# Patient Record
Sex: Female | Born: 1970 | Race: White | Hispanic: No | Marital: Married | Smoking: Never smoker
Health system: Southern US, Community
[De-identification: ages and names within clinical notes are randomized; demographics above are authoritative.]

## PROBLEM LIST (undated history)

## (undated) DIAGNOSIS — K219 Gastro-esophageal reflux disease without esophagitis: Secondary | ICD-10-CM

## (undated) DIAGNOSIS — IMO0001 Reserved for inherently not codable concepts without codable children: Secondary | ICD-10-CM

## (undated) DIAGNOSIS — I10 Essential (primary) hypertension: Secondary | ICD-10-CM

## (undated) DIAGNOSIS — L680 Hirsutism: Secondary | ICD-10-CM

## (undated) DIAGNOSIS — E119 Type 2 diabetes mellitus without complications: Secondary | ICD-10-CM

## (undated) DIAGNOSIS — E7800 Pure hypercholesterolemia, unspecified: Secondary | ICD-10-CM

## (undated) HISTORY — DX: Hirsutism: L68.0

## (undated) HISTORY — PX: HYSTEROSCOPY: SHX211

## (undated) HISTORY — DX: Pure hypercholesterolemia, unspecified: E78.00

## (undated) HISTORY — DX: Essential (primary) hypertension: I10

## (undated) HISTORY — DX: Gastro-esophageal reflux disease without esophagitis: K21.9

## (undated) HISTORY — DX: Type 2 diabetes mellitus without complications: E11.9

## (undated) HISTORY — DX: Reserved for inherently not codable concepts without codable children: IMO0001

---

## 2001-07-11 ENCOUNTER — Other Ambulatory Visit: Admission: RE | Admit: 2001-07-11 | Discharge: 2001-07-11 | Payer: Self-pay | Admitting: Unknown Physician Specialty

## 2008-05-11 ENCOUNTER — Ambulatory Visit: Payer: Self-pay | Admitting: Obstetrics and Gynecology

## 2008-05-11 ENCOUNTER — Encounter: Payer: Self-pay | Admitting: Obstetrics and Gynecology

## 2008-05-11 ENCOUNTER — Other Ambulatory Visit: Admission: RE | Admit: 2008-05-11 | Discharge: 2008-05-11 | Payer: Self-pay | Admitting: Obstetrics and Gynecology

## 2008-05-24 ENCOUNTER — Ambulatory Visit: Payer: Self-pay | Admitting: Obstetrics and Gynecology

## 2008-05-24 HISTORY — PX: ENDOMETRIAL ABLATION: SHX621

## 2008-06-13 ENCOUNTER — Ambulatory Visit: Payer: Self-pay | Admitting: Obstetrics and Gynecology

## 2008-06-15 ENCOUNTER — Ambulatory Visit: Payer: Self-pay | Admitting: Obstetrics and Gynecology

## 2008-07-02 ENCOUNTER — Ambulatory Visit: Payer: Self-pay | Admitting: Obstetrics and Gynecology

## 2010-04-11 ENCOUNTER — Encounter: Payer: Self-pay | Admitting: Obstetrics and Gynecology

## 2010-04-24 ENCOUNTER — Encounter (INDEPENDENT_AMBULATORY_CARE_PROVIDER_SITE_OTHER): Payer: 59 | Admitting: Obstetrics and Gynecology

## 2010-04-24 ENCOUNTER — Other Ambulatory Visit: Payer: Self-pay | Admitting: Obstetrics and Gynecology

## 2010-04-24 ENCOUNTER — Other Ambulatory Visit (HOSPITAL_COMMUNITY)
Admission: RE | Admit: 2010-04-24 | Discharge: 2010-04-24 | Disposition: A | Discharge status: Home / Self Care | Payer: 59 | Source: Ambulatory Visit | Attending: Obstetrics and Gynecology | Admitting: Obstetrics and Gynecology

## 2010-04-24 DIAGNOSIS — Z833 Family history of diabetes mellitus: Secondary | ICD-10-CM

## 2010-04-24 DIAGNOSIS — Z13220 Encounter for screening for lipoid disorders: Secondary | ICD-10-CM

## 2010-04-24 DIAGNOSIS — R82998 Other abnormal findings in urine: Secondary | ICD-10-CM

## 2010-04-24 DIAGNOSIS — Z124 Encounter for screening for malignant neoplasm of cervix: Secondary | ICD-10-CM | POA: Insufficient documentation

## 2010-04-24 DIAGNOSIS — Z01419 Encounter for gynecological examination (general) (routine) without abnormal findings: Secondary | ICD-10-CM

## 2010-04-28 ENCOUNTER — Other Ambulatory Visit: Payer: Self-pay | Admitting: Obstetrics and Gynecology

## 2010-04-28 DIAGNOSIS — Z1231 Encounter for screening mammogram for malignant neoplasm of breast: Secondary | ICD-10-CM

## 2010-05-13 ENCOUNTER — Ambulatory Visit
Admission: RE | Admit: 2010-05-13 | Discharge: 2010-05-13 | Disposition: A | Discharge status: Home / Self Care | Payer: 59 | Source: Ambulatory Visit | Attending: Obstetrics and Gynecology | Admitting: Obstetrics and Gynecology

## 2010-05-13 DIAGNOSIS — Z1231 Encounter for screening mammogram for malignant neoplasm of breast: Secondary | ICD-10-CM

## 2010-12-18 ENCOUNTER — Ambulatory Visit (INDEPENDENT_AMBULATORY_CARE_PROVIDER_SITE_OTHER): Payer: 59 | Admitting: Obstetrics and Gynecology

## 2010-12-18 DIAGNOSIS — E7800 Pure hypercholesterolemia, unspecified: Secondary | ICD-10-CM

## 2010-12-19 ENCOUNTER — Other Ambulatory Visit: Payer: Self-pay | Admitting: *Deleted

## 2010-12-19 DIAGNOSIS — E7800 Pure hypercholesterolemia, unspecified: Secondary | ICD-10-CM

## 2011-01-02 ENCOUNTER — Ambulatory Visit: Payer: 59 | Admitting: Cardiovascular Disease

## 2011-01-12 ENCOUNTER — Ambulatory Visit (INDEPENDENT_AMBULATORY_CARE_PROVIDER_SITE_OTHER): Payer: 59 | Admitting: Cardiovascular Disease

## 2011-01-12 ENCOUNTER — Encounter: Payer: Self-pay | Admitting: Cardiovascular Disease

## 2011-01-12 VITALS — BP 128/78 | HR 80 | Ht 69.0 in | Wt 205.0 lb

## 2011-01-12 DIAGNOSIS — E7800 Pure hypercholesterolemia, unspecified: Secondary | ICD-10-CM | POA: Insufficient documentation

## 2011-01-12 NOTE — Patient Instructions (Signed)
Your physician recommends that you schedule a follow-up appointment as needed.   Your physician recommends that you continue on your current medications as directed. Please refer to the Current Medication list given to you today.  

## 2011-01-12 NOTE — Assessment & Plan Note (Signed)
The patient has mixed dyslipidemia. Her cardiovascular risk profile is otherwise falls in a low risk category. She reports poor eating habits and we spent an extensive amount of time discussing a strategy for diet and exercise. The specific recommendations include cutting back dramatically on fast food, reducing starch, using frequent healthy snacks, and initiating a walking program for exercise. I recommended a 6 month trial of lifestyle modification and followup lipids at that time. She sees Dr. Selinda Flavin for primary care and I recommended that she follow up with him for a repeat lipid panel and further recommendations. I would be happy to see her back at any time if questions arise. I suspect she will not require pharmacotherapy for lipid-lowering.  She has no symptoms or physical findings of overt cardiovascular disease.

## 2011-01-12 NOTE — Progress Notes (Signed)
HPI:  This is a 40 year old woman presented for initial evaluation of hypercholesterolemia. She has no past history of hypertension, diabetes, coronary artery disease, or smoking. Recent lipids showed a cholesterol of 215, HDL 39, triglycerides 278, and LDL 121. She was referred for further evaluation and treatment. The patient has no clinical symptoms of cardiac disease. She specifically denies chest pain, dyspnea, palpitations, lightheadedness, syncope, edema, or claudication symptoms. She reports poor eating habits and frequently needs fast food and sweets. She has a stressful job. She is not engage in regular exercise.  Outpatient Encounter Prescriptions as of 01/12/2011  Medication Sig Dispense Refill  . Esomeprazole Magnesium (NEXIUM PO) Take 1 capsule by mouth daily.       . IBUPROFEN PO Take by mouth as needed.       Marland Kitchen spironolactone (ALDACTONE) 50 MG tablet Take 1 tablet by mouth Twice daily.        Review of patient's allergies indicates no known allergies.  Past Medical History  Diagnosis Date  . Hirsutism     Past Surgical History  Procedure Date  . Endometrial ablation 05/2008    History   Social History  . Marital Status: Married    Spouse Name: N/A    Number of Children: 2  . Years of Education: N/A   Occupational History  . Production designer, theatre/television/film at [employer]    Social History Main Topics  . Smoking status: Never Smoker   . Smokeless tobacco: Not on file  . Alcohol Use: Yes     1-2 yearly maybe  . Drug Use: No  . Sexually Active: Not on file   Other Topics Concern  . Not on file   Social History Narrative  . No narrative on file    Family History  Problem Relation Age of Onset  . Hypertension Mother   . Hypertension Father   . Hypertension Brother   . Heart disease Maternal Grandmother   . Breast cancer Maternal Grandmother     BREAST CANCER  . Cancer Maternal Grandfather     LUNG CA  . Cancer Paternal Grandmother     BRAIN CA    ROS: General: no  fevers/chills/night sweats Eyes: no blurry vision, diplopia, or amaurosis ENT: no sore throat or hearing loss Resp: no cough, wheezing, or hemoptysis CV: no edema or palpitations GI: no abdominal pain, nausea, vomiting, diarrhea, or constipation GU: no dysuria, frequency, or hematuria Skin: no rash Neuro: positive for headaches Musculoskeletal: no joint pain or swelling Heme: no bleeding, DVT, or easy bruising Endo: no polydipsia or polyuria  BP 128/78  Pulse 80  Ht 5\' 9"  (1.753 m)  Wt 92.987 kg (205 lb)  BMI 30.27 kg/m2  PHYSICAL EXAM: Pt is alert and oriented, WD, WN, very pleasant woman in no distress. HEENT: normal Neck: JVP normal. Carotid upstrokes normal without bruits. No thyromegaly. Lungs: equal expansion, clear bilaterally CV: Apex is discrete and nondisplaced, RRR without murmur or gallop Abd: soft, NT, +BS, no bruit, no hepatosplenomegaly Back: no CVA tenderness Ext: no C/C/E        Femoral pulses 2+= without bruits        DP/PT pulses intact and = Skin: warm and dry without rash Neuro: CNII-XII intact             Strength intact = bilaterally  EKG:  Normal sinus rhythm 80 beats per minute, within normal limits.  ASSESSMENT AND PLAN:

## 2011-05-26 ENCOUNTER — Other Ambulatory Visit: Payer: Self-pay | Admitting: Obstetrics and Gynecology

## 2011-05-26 DIAGNOSIS — Z1231 Encounter for screening mammogram for malignant neoplasm of breast: Secondary | ICD-10-CM

## 2011-06-03 ENCOUNTER — Ambulatory Visit
Admission: RE | Admit: 2011-06-03 | Discharge: 2011-06-03 | Disposition: A | Discharge status: Home / Self Care | Payer: 59 | Source: Ambulatory Visit | Attending: Obstetrics and Gynecology | Admitting: Obstetrics and Gynecology

## 2011-06-03 DIAGNOSIS — Z1231 Encounter for screening mammogram for malignant neoplasm of breast: Secondary | ICD-10-CM

## 2011-06-04 ENCOUNTER — Encounter: Payer: Self-pay | Admitting: Obstetrics and Gynecology

## 2011-06-04 ENCOUNTER — Ambulatory Visit (INDEPENDENT_AMBULATORY_CARE_PROVIDER_SITE_OTHER): Payer: 59 | Admitting: Obstetrics and Gynecology

## 2011-06-04 ENCOUNTER — Other Ambulatory Visit (HOSPITAL_COMMUNITY)
Admission: RE | Admit: 2011-06-04 | Discharge: 2011-06-04 | Disposition: A | Discharge status: Home / Self Care | Payer: 59 | Source: Ambulatory Visit | Attending: Obstetrics and Gynecology | Admitting: Obstetrics and Gynecology

## 2011-06-04 VITALS — BP 124/80 | Ht 69.0 in | Wt 206.0 lb

## 2011-06-04 DIAGNOSIS — Z01419 Encounter for gynecological examination (general) (routine) without abnormal findings: Secondary | ICD-10-CM

## 2011-06-04 DIAGNOSIS — E7800 Pure hypercholesterolemia, unspecified: Secondary | ICD-10-CM

## 2011-06-04 NOTE — Progress Notes (Signed)
The patient came to see me today for annual GYN exam. Her periods are very tolerable since we did her endometrial ablation. She continues to have PMDD. She previously tried Effexor and Cymbalta but both cause sexual side effects although they were effective for PMDD. She notices increased hair growth on her face. She had normal female hormone levels. She tried 100 mg of spironolactone daily without help. She would like to try something else. She had her mammogram yesterday. She had elevated lipids previously and saw  Dr. Daleen Squibb. He did not feel she needed medication.  Physical examination: HEENT within normal limits. Neck: Thyroid not large. No masses. Supraclavicular nodes: not enlarged. Breasts: Examined in both sitting and lying  position. No skin changes and no masses. Abdomen: Soft no guarding rebound or masses or hernia. Pelvic: External: Within normal limits. BUS: Within normal limits. Vaginal:within normal limits. Good estrogen effect. No evidence of cystocele rectocele or enterocele. Cervix: clean. Uterus: Normal size and shape. Adnexa: No masses. Rectovaginal exam: Confirmatory and negative. Extremities: Within normal limits.  Assessment: #1. PMDD #2. Increased hair growth #3. Grandmother with early onset of breast cancer. No other family history of breast or ovarian cancer.  Plan: We are going to try oral contraceptives for both PMDD and increased hair growth. She was given samples of low Loestrin. She will let me know how she is doing. We discussed that we could add back to Aldactone as well. We discussed BRCA1 and BRCA2 testing. She understands that her risk is low although not 0. She will inform if she is interested in counseling or  Testing. She will return for lipid profile fasted.

## 2011-06-05 LAB — URINALYSIS W MICROSCOPIC + REFLEX CULTURE
Hgb urine dipstick: NEGATIVE
Nitrite: POSITIVE — AB
Protein, ur: NEGATIVE mg/dL
pH: 5.5 (ref 5.0–8.0)

## 2011-06-08 ENCOUNTER — Other Ambulatory Visit: Payer: Self-pay | Admitting: *Deleted

## 2011-06-08 DIAGNOSIS — N390 Urinary tract infection, site not specified: Secondary | ICD-10-CM

## 2011-06-08 LAB — URINE CULTURE: Colony Count: >=100000

## 2011-06-08 MED ORDER — NITROFURANTOIN MONOHYD MACRO 100 MG PO CAPS: 100.0000 mg | ORAL_CAPSULE | Freq: Two times a day (BID) | ORAL | Status: AC

## 2011-07-01 ENCOUNTER — Other Ambulatory Visit: Payer: 59

## 2011-07-01 DIAGNOSIS — E7800 Pure hypercholesterolemia, unspecified: Secondary | ICD-10-CM

## 2011-07-01 DIAGNOSIS — Z01419 Encounter for gynecological examination (general) (routine) without abnormal findings: Secondary | ICD-10-CM

## 2011-07-01 DIAGNOSIS — N390 Urinary tract infection, site not specified: Secondary | ICD-10-CM

## 2011-07-01 LAB — CBC WITH DIFFERENTIAL/PLATELET
Basophils Absolute: 0 10*3/uL (ref 0.0–0.1)
Basophils Relative: 0 % (ref 0–1)
Eosinophils Absolute: 0.1 10*3/uL (ref 0.0–0.7)
Eosinophils Relative: 2 % (ref 0–5)
HCT: 38.1 % (ref 36.0–46.0)
Hemoglobin: 12.4 g/dL (ref 12.0–15.0)
Lymphocytes Relative: 39 % (ref 12–46)
Lymphs Abs: 2 10*3/uL (ref 0.7–4.0)
MCH: 28.8 pg (ref 26.0–34.0)
MCHC: 32.5 g/dL (ref 30.0–36.0)
MCV: 88.6 fL (ref 78.0–100.0)
Monocytes Absolute: 0.3 10*3/uL (ref 0.1–1.0)
Monocytes Relative: 6 % (ref 3–12)
Neutro Abs: 2.6 10*3/uL (ref 1.7–7.7)
Neutrophils Relative %: 52 % (ref 43–77)
Platelets: 207 10*3/uL (ref 150–400)
RBC: 4.3 MIL/uL (ref 3.87–5.11)
RDW: 13.1 % (ref 11.5–15.5)
WBC: 5 10*3/uL (ref 4.0–10.5)

## 2011-07-01 LAB — LIPID PANEL
HDL: 41 mg/dL (ref >39)
LDL Cholesterol: 101 mg/dL — ABNORMAL HIGH (ref 0–99)

## 2011-07-02 ENCOUNTER — Other Ambulatory Visit: Payer: Self-pay | Admitting: *Deleted

## 2011-07-02 DIAGNOSIS — R319 Hematuria, unspecified: Secondary | ICD-10-CM

## 2011-07-02 LAB — URINALYSIS W MICROSCOPIC + REFLEX CULTURE
Bacteria, UA: NONE SEEN
Crystals: NONE SEEN
Leukocytes, UA: NEGATIVE
Nitrite: NEGATIVE
Specific Gravity, Urine: 1.021 (ref 1.005–1.030)
Squamous Epithelial / LPF: NONE SEEN
Urobilinogen, UA: 0.2 mg/dL (ref 0.0–1.0)

## 2011-07-03 ENCOUNTER — Other Ambulatory Visit: Payer: Self-pay | Admitting: *Deleted

## 2011-07-03 MED ORDER — NITROFURANTOIN MONOHYD MACRO 100 MG PO CAPS: 100.0000 mg | ORAL_CAPSULE | Freq: Two times a day (BID) | ORAL | Status: AC

## 2011-07-04 LAB — URINE CULTURE: Colony Count: >=100000

## 2011-07-29 ENCOUNTER — Other Ambulatory Visit: Payer: 59

## 2011-07-29 DIAGNOSIS — R319 Hematuria, unspecified: Secondary | ICD-10-CM

## 2011-07-30 LAB — URINALYSIS W MICROSCOPIC + REFLEX CULTURE
Crystals: NONE SEEN
Nitrite: POSITIVE — AB
Protein, ur: NEGATIVE mg/dL
Specific Gravity, Urine: 1.009 (ref 1.005–1.030)
Squamous Epithelial / LPF: NONE SEEN
Urobilinogen, UA: 0.2 mg/dL (ref 0.0–1.0)

## 2011-08-01 LAB — URINE CULTURE

## 2011-08-04 ENCOUNTER — Other Ambulatory Visit: Payer: Self-pay | Admitting: *Deleted

## 2011-08-04 ENCOUNTER — Telehealth: Payer: Self-pay | Admitting: *Deleted

## 2011-08-04 DIAGNOSIS — N390 Urinary tract infection, site not specified: Secondary | ICD-10-CM

## 2011-08-04 MED ORDER — CIPROFLOXACIN HCL 500 MG PO TABS: 500.0000 mg | ORAL_TABLET | Freq: Two times a day (BID) | ORAL | Status: AC

## 2011-08-04 MED ORDER — NORETHIN-ETH ESTRAD-FE BIPHAS 1 MG-10 MCG / 10 MCG PO TABS: 1.0000 | ORAL_TABLET | Freq: Every day | ORAL | Status: DC

## 2011-08-04 NOTE — Telephone Encounter (Signed)
Continue birth control pills. You can give her one year of refills.

## 2011-08-04 NOTE — Telephone Encounter (Signed)
Patient called to say she had done well on oc's.  Wants to know if she should continue? If so will need one month worth at [facility] then 90 day through [facility].  Please advise.

## 2011-08-04 NOTE — Telephone Encounter (Signed)
Addended byValeda Malm L on: 08/04/2011 04:05 PM   Modules accepted: Orders

## 2013-09-05 ENCOUNTER — Encounter: Payer: Self-pay | Admitting: Women's Health

## 2013-09-28 ENCOUNTER — Other Ambulatory Visit (HOSPITAL_COMMUNITY)
Admission: RE | Admit: 2013-09-28 | Discharge: 2013-09-28 | Disposition: A | Discharge status: Home / Self Care | Payer: 59 | Source: Ambulatory Visit | Attending: Women's Health | Admitting: Women's Health

## 2013-09-28 ENCOUNTER — Ambulatory Visit (INDEPENDENT_AMBULATORY_CARE_PROVIDER_SITE_OTHER): Payer: 59 | Admitting: Women's Health

## 2013-09-28 ENCOUNTER — Encounter: Payer: Self-pay | Admitting: Women's Health

## 2013-09-28 VITALS — BP 124/78 | Ht 68.5 in | Wt 199.8 lb

## 2013-09-28 DIAGNOSIS — Z1151 Encounter for screening for human papillomavirus (HPV): Secondary | ICD-10-CM | POA: Insufficient documentation

## 2013-09-28 DIAGNOSIS — Z01419 Encounter for gynecological examination (general) (routine) without abnormal findings: Secondary | ICD-10-CM | POA: Insufficient documentation

## 2013-09-28 DIAGNOSIS — R1031 Right lower quadrant pain: Secondary | ICD-10-CM

## 2013-09-28 DIAGNOSIS — N943 Premenstrual tension syndrome: Secondary | ICD-10-CM

## 2013-09-28 DIAGNOSIS — G8929 Other chronic pain: Secondary | ICD-10-CM

## 2013-09-28 MED ORDER — FLUOXETINE HCL 10 MG PO TABS: 10.0000 mg | ORAL_TABLET | Freq: Every day | ORAL | Status: DC

## 2013-09-28 NOTE — Patient Instructions (Signed)

## 2013-09-28 NOTE — Progress Notes (Signed)
[place] 05-Apr-1970 [id number]    History:    Presents for annual exam.  Light monthly cycle/vasectomy/2010 ablation. Struggles with PMDD. Normal Pap and mammogram history. Last annual and Mammogram 2013. 2013 triglycerides 226. Hypertension primary care managing.  Past medical history, past surgical history, family history and social history were all reviewed and documented in the EPIC chart. 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 Parents hypertension.  ROS:  A  12 point ROS was performed and pertinent positives and negatives are included.  Exam:  Filed Vitals:   09/28/13 0836  BP: 124/78    General appearance:  Normal Thyroid:  Symmetrical, normal in size, without palpable masses or nodularity. Respiratory  Auscultation:  Clear without wheezing or rhonchi Cardiovascular  Auscultation:  Regular rate, without rubs, murmurs or gallops  Edema/varicosities:  Not grossly evident Abdominal  Soft,nontender, without masses, guarding or rebound.  Liver/spleen:  No organomegaly noted  Hernia:  None appreciated  Skin  Inspection:  Grossly normal   Breasts: Examined lying and sitting.     Right: Without masses, retractions, discharge or axillary adenopathy.     Left: Without masses, retractions, discharge or axillary adenopathy. Gentitourinary   Inguinal/mons:  Normal without inguinal adenopathy  External genitalia:  Normal  BUS/Urethra/Skene's glands:  Normal  Vagina:  Normal  Cervix:  Normal  Uterus:   normal in size, shape and contour.  Midline and mobile  Adnexa/parametria:     Rt: Without masses or tenderness.   Lt: Without masses or tenderness.  Anus and perineum: Normal  Digital rectal exam: Normal sphincter tone without palpated masses or tenderness  Assessment/Plan:  43 y.o. MWF G2P2 for annual exam with complaint of PMS mood symptoms and intermittent right lower abdomen stabbing type pain.    PMS Light monthly  cycles/ablation/vasectomy Hypertension-labs at primary care  Plan: Options reviewed, increase exercise, Prozac 10 mg, states would prefer no medications but needs help,  will try. Prozac 10 mg day 12 through 24, continue to work on increasing exercise, leisure activities. Instructed to call if no relief. SBE's, schedule annual screening mammogram, calcium rich diet, vitamin D 1000 daily encouraged. Labs at primary care with HR HPV typing new screening guidelines reviewed. Fish oil supplement twice daily. Schedule ultrasound to rule out  ovarian related problem.  Note: This dictation was prepared with Dragon/digital dictation.  Any transcriptional errors that result are unintentional. [place], 1:57 PM 09/28/2013

## 2013-09-29 LAB — URINALYSIS W MICROSCOPIC + REFLEX CULTURE
BILIRUBIN URINE: NEGATIVE
Casts: NONE SEEN
Crystals: NONE SEEN
Glucose, UA: NEGATIVE mg/dL
HGB URINE DIPSTICK: NEGATIVE
Ketones, ur: NEGATIVE mg/dL
Nitrite: NEGATIVE
PROTEIN: NEGATIVE mg/dL
Specific Gravity, Urine: 1.016 (ref 1.005–1.030)
Urobilinogen, UA: 0.2 mg/dL (ref 0.0–1.0)
pH: 6 (ref 5.0–8.0)

## 2013-10-01 LAB — URINE CULTURE

## 2013-10-02 ENCOUNTER — Other Ambulatory Visit: Payer: Self-pay | Admitting: Women's Health

## 2013-10-02 LAB — CYTOLOGY - PAP

## 2013-10-02 MED ORDER — SULFAMETHOXAZOLE-TMP DS 800-160 MG PO TABS: 1.0000 | ORAL_TABLET | Freq: Two times a day (BID) | ORAL | Status: DC

## 2013-10-03 ENCOUNTER — Encounter: Payer: Self-pay | Admitting: Obstetrics and Gynecology

## 2013-10-16 ENCOUNTER — Telehealth: Payer: Self-pay | Admitting: *Deleted

## 2013-10-16 MED ORDER — CIPROFLOXACIN HCL 250 MG PO TABS: 250.0000 mg | ORAL_TABLET | Freq: Two times a day (BID) | ORAL | Status: DC

## 2013-10-16 NOTE — Telephone Encounter (Signed)
(  you are back up md) Pt was treated for UTI on 10/02/13 with Septra DS #6 tab, pt is c/o urgency and frequency. Pt asked if refill could be given?

## 2013-10-16 NOTE — Telephone Encounter (Signed)
Ciprofloxacin 250 mg twice a day x5 days. I sent a prescription through

## 2013-10-16 NOTE — Telephone Encounter (Signed)
Pt informed with the below note. 

## 2013-10-18 ENCOUNTER — Other Ambulatory Visit: Payer: 59

## 2013-10-18 ENCOUNTER — Ambulatory Visit: Payer: 59 | Admitting: Women's Health

## 2013-10-25 ENCOUNTER — Ambulatory Visit (INDEPENDENT_AMBULATORY_CARE_PROVIDER_SITE_OTHER): Payer: 59

## 2013-10-25 ENCOUNTER — Encounter: Payer: Self-pay | Admitting: Women's Health

## 2013-10-25 ENCOUNTER — Other Ambulatory Visit: Payer: Self-pay | Admitting: Women's Health

## 2013-10-25 ENCOUNTER — Ambulatory Visit (INDEPENDENT_AMBULATORY_CARE_PROVIDER_SITE_OTHER): Payer: 59 | Admitting: Women's Health

## 2013-10-25 DIAGNOSIS — N852 Hypertrophy of uterus: Secondary | ICD-10-CM

## 2013-10-25 DIAGNOSIS — D259 Leiomyoma of uterus, unspecified: Secondary | ICD-10-CM

## 2013-10-25 DIAGNOSIS — G8929 Other chronic pain: Secondary | ICD-10-CM

## 2013-10-25 DIAGNOSIS — R1031 Right lower quadrant pain: Principal | ICD-10-CM

## 2013-10-25 DIAGNOSIS — D251 Intramural leiomyoma of uterus: Secondary | ICD-10-CM | POA: Insufficient documentation

## 2013-10-25 NOTE — Progress Notes (Signed)
Patient ID: Allison Patterson, female   DOB: Mar 05, 1970, 43 y.o.   MRN: [id number] Presents for ultrasound. At annual exam complained of intermittent right lower quadrant pain stabbing in nature for the past few months. Endometrial ablation 2010 monthly cycles, most months are light.  Ultrasound: Transvaginal and transabdominal anteverted enlarged uterus uterus intramural fibroid 9 x 10 mm, right and left ovary normal with arterial blood flow noted. No apparent mass seen in the right or left adnexal. No free fluid.  Small intramural uterine fibroid  Plan: Reviewed normality of exam/ultrasound. Will keep pain calendar to track if activity, food or ovulation connected.

## 2013-12-25 ENCOUNTER — Encounter: Payer: Self-pay | Admitting: Women's Health

## 2014-10-02 ENCOUNTER — Encounter: Payer: Self-pay | Admitting: Women's Health

## 2014-10-18 ENCOUNTER — Ambulatory Visit (INDEPENDENT_AMBULATORY_CARE_PROVIDER_SITE_OTHER): Payer: 59 | Admitting: Women's Health

## 2014-10-18 ENCOUNTER — Encounter: Payer: Self-pay | Admitting: Women's Health

## 2014-10-18 VITALS — BP 118/80 | Ht 68.0 in | Wt 211.0 lb

## 2014-10-18 DIAGNOSIS — Z01419 Encounter for gynecological examination (general) (routine) without abnormal findings: Secondary | ICD-10-CM

## 2014-10-18 NOTE — Progress Notes (Signed)
[place] 1970/12/27 [id number]    History:    Presents for annual exam.  Monthly cycle/vasectomy/2010 ablation. Normal Pap and mammogram history. History of a small fibroid. Hypertension and anxiety managed by primary care.  Past medical history, past surgical history, family history and social history were all reviewed and documented in the EPIC chart. Works in Engineer, mining. 2 sons grown both doing well. Husband police officer planning to retire next year. Parents hypertension.  ROS:  A ROS was performed and pertinent positives and negatives are included.  Exam:  Filed Vitals:   10/18/14 1611  BP: 118/80    General appearance:  Normal Thyroid:  Symmetrical, normal in size, without palpable masses or nodularity. Respiratory  Auscultation:  Clear without wheezing or rhonchi Cardiovascular  Auscultation:  Regular rate, without rubs, murmurs or gallops  Edema/varicosities:  Not grossly evident Abdominal  Soft,nontender, without masses, guarding or rebound.  Liver/spleen:  No organomegaly noted  Hernia:  None appreciated  Skin  Inspection:  Grossly normal   Breasts: Examined lying and sitting.     Right: Without masses, retractions, discharge or axillary adenopathy.     Left: Without masses, retractions, discharge or axillary adenopathy. Gentitourinary   Inguinal/mons:  Normal without inguinal adenopathy  External genitalia:  Normal  BUS/Urethra/Skene's glands:  Normal  Vagina:  Normal  Cervix:  Normal  Uterus:   normal in size, shape and contour.  Midline and mobile  Adnexa/parametria:     Rt: Without masses or tenderness.   Lt: Without masses or tenderness.  Anus and perineum: Normal  Digital rectal exam: Normal sphincter tone without palpated masses or tenderness  Assessment/Plan:  44 y.o. MWF G2 P2 for annual exam with no complaints.  Light monthly cycles/vasectomy/ablation Small fibroid-1 cm Hypertension-primary care manages labs and meds  Plan: SBE's,  annual screening mammogram, instructed to schedule overdue. Reviewed importance of annual screen. Exercise, calcium rich diet,  vitamin D 1000 daily encouraged. Reviewed importance of decreasing calories for weight loss. UA, Pap normal with negative HR HPV typing 2015, new screening guidelines reviewed. Encouraged increasing leisure activities, yoga to deal with situational stress at work. Taking a when necessary antianxiety med per primary care.  Huel Cote WHNP, 5:10 PM 10/18/2014

## 2014-10-18 NOTE — Patient Instructions (Signed)

## 2014-10-19 LAB — URINALYSIS W MICROSCOPIC + REFLEX CULTURE
Bilirubin Urine: NEGATIVE
CASTS: NONE SEEN [LPF]
Crystals: NONE SEEN [HPF]
Glucose, UA: NEGATIVE
Hgb urine dipstick: NEGATIVE
Ketones, ur: NEGATIVE
NITRITE: NEGATIVE
PH: 6 (ref 5.0–8.0)
PROTEIN: NEGATIVE
Specific Gravity, Urine: 1.01 (ref 1.001–1.035)
YEAST: NONE SEEN [HPF]

## 2014-10-21 LAB — URINE CULTURE: Colony Count: >=100000

## 2014-10-23 ENCOUNTER — Other Ambulatory Visit: Payer: Self-pay | Admitting: Women's Health

## 2014-10-23 MED ORDER — CIPROFLOXACIN HCL 250 MG PO TABS: 250.0000 mg | ORAL_TABLET | Freq: Two times a day (BID) | ORAL | Status: DC

## 2015-04-25 ENCOUNTER — Other Ambulatory Visit: Payer: Self-pay

## 2015-04-25 DIAGNOSIS — Z1231 Encounter for screening mammogram for malignant neoplasm of breast: Secondary | ICD-10-CM

## 2015-05-01 ENCOUNTER — Ambulatory Visit
Admission: RE | Admit: 2015-05-01 | Discharge: 2015-05-01 | Disposition: A | Discharge status: Home / Self Care | Payer: 59 | Source: Ambulatory Visit

## 2015-05-01 DIAGNOSIS — Z1231 Encounter for screening mammogram for malignant neoplasm of breast: Secondary | ICD-10-CM

## 2016-01-22 ENCOUNTER — Ambulatory Visit (INDEPENDENT_AMBULATORY_CARE_PROVIDER_SITE_OTHER): Payer: 59 | Admitting: Women's Health

## 2016-01-22 ENCOUNTER — Encounter: Payer: Self-pay | Admitting: Women's Health

## 2016-01-22 VITALS — BP 119/80 | Ht 68.0 in | Wt 214.4 lb

## 2016-01-22 DIAGNOSIS — E1169 Type 2 diabetes mellitus with other specified complication: Secondary | ICD-10-CM | POA: Insufficient documentation

## 2016-01-22 DIAGNOSIS — N943 Premenstrual tension syndrome: Secondary | ICD-10-CM | POA: Diagnosis not present

## 2016-01-22 DIAGNOSIS — Z01419 Encounter for gynecological examination (general) (routine) without abnormal findings: Secondary | ICD-10-CM

## 2016-01-22 DIAGNOSIS — E669 Obesity, unspecified: Secondary | ICD-10-CM

## 2016-01-22 MED ORDER — FLUOXETINE HCL 10 MG PO CAPS: 10.0000 mg | ORAL_CAPSULE | Freq: Every day | ORAL | 12 refills | Status: DC

## 2016-01-22 NOTE — Patient Instructions (Signed)
Carbohydrate Counting for Diabetes Mellitus, Adult Carbohydrate counting is a method for keeping track of how many carbohydrates you eat. Eating carbohydrates naturally increases the amount of sugar (glucose) in the blood. Counting how many carbohydrates you eat helps keep your blood glucose within normal limits, which helps you manage your diabetes (diabetes mellitus). It is important to know how many carbohydrates you can safely have in each meal. This is different for every person. A diet and nutrition specialist (registered dietitian) can help you make a meal plan and calculate how many carbohydrates you should have at each meal and snack. Carbohydrates are found in the following foods:  Grains, such as breads and cereals.  Dried beans and soy products.  Starchy vegetables, such as potatoes, peas, and corn.  Fruit and fruit juices.  Milk and yogurt.  Sweets and snack foods, such as cake, cookies, candy, chips, and soft drinks. How do I count carbohydrates? There are two ways to count carbohydrates in food. You can use either of the methods or a combination of both. Reading "Nutrition Facts" on packaged food  The "Nutrition Facts" list is included on the labels of almost all packaged foods and beverages in the [place] It includes:  The serving size.  Information about nutrients in each serving, including the grams (g) of carbohydrate per serving. To use the "Nutrition Facts":  Decide how many servings you will have.  Multiply the number of servings by the number of carbohydrates per serving.  The resulting number is the total amount of carbohydrates that you will be having. Learning standard serving sizes of other foods  When you eat foods containing carbohydrates that are not packaged or do not include "Nutrition Facts" on the label, you need to measure the servings in order to count the amount of carbohydrates:  Measure the foods that you will eat with a food scale or measuring  cup, if needed.  Decide how many standard-size servings you will eat.  Multiply the number of servings by 15. Most carbohydrate-rich foods have about 15 g of carbohydrates per serving.  For example, if you eat 8 oz (170 g) of strawberries, you will have eaten 2 servings and 30 g of carbohydrates (2 servings x 15 g = 30 g).  For foods that have more than one food mixed, such as soups and casseroles, you must count the carbohydrates in each food that is included. The following list contains standard serving sizes of common carbohydrate-rich foods. Each of these servings has about 15 g of carbohydrates:   hamburger bun or  English muffin.   oz (15 mL) syrup.   oz (14 g) jelly.  1 slice of bread.  1 six-inch tortilla.  3 oz (85 g) cooked rice or pasta.  4 oz (113 g) cooked dried beans.  4 oz (113 g) starchy vegetable, such as peas, corn, or potatoes.  4 oz (113 g) hot cereal.  4 oz (113 g) mashed potatoes or  of a large baked potato.  4 oz (113 g) canned or frozen fruit.  4 oz (120 mL) fruit juice.  4-6 crackers.  6 chicken nuggets.  6 oz (170 g) unsweetened dry cereal.  6 oz (170 g) plain fat-free yogurt or yogurt sweetened with artificial sweeteners.  8 oz (240 mL) milk.  8 oz (170 g) fresh fruit or one small piece of fruit.  24 oz (680 g) popped popcorn. Example of carbohydrate counting Sample meal  3 oz (85 g) chicken breast.  6 oz (  170 g) brown rice.  4 oz (113 g) corn.  8 oz (240 mL) milk.  8 oz (170 g) strawberries with sugar-free whipped topping. Carbohydrate calculation 1. Identify the foods that contain carbohydrates:  Rice.  Corn.  Milk.  Strawberries. 2. Calculate how many servings you have of each food:  2 servings rice.  1 serving corn.  1 serving milk.  1 serving strawberries. 3. Multiply each number of servings by 15 g:  2 servings rice x 15 g = 30 g.  1 serving corn x 15 g = 15 g.  1 serving milk x 15 g = 15  g.  1 serving strawberries x 15 g = 15 g. 4. Add together all of the amounts to find the total grams of carbohydrates eaten:  30 g + 15 g + 15 g + 15 g = 75 g of carbohydrates total. This information is not intended to replace advice given to you by your health care provider. Make sure you discuss any questions you have with your health care provider. Document Released: 02/09/2005 Document Revised: 08/30/2015 Document Reviewed: 07/24/2015 Elsevier Interactive Patient Education  2017 [place] Maintenance, Female Introduction Adopting a healthy lifestyle and getting preventive care can go a long way to promote health and wellness. Talk with your health care provider about what schedule of regular examinations is right for you. This is a good chance for you to check in with your provider about disease prevention and staying healthy. In between checkups, there are plenty of things you can do on your own. Experts have done a lot of research about which lifestyle changes and preventive measures are most likely to keep you healthy. Ask your health care provider for more information. Weight and diet Eat a healthy diet  Be sure to include plenty of vegetables, fruits, low-fat dairy products, and lean protein.  Do not eat a lot of foods high in solid fats, added sugars, or salt.  Get regular exercise. This is one of the most important things you can do for your health.  Most adults should exercise for at least 150 minutes each week. The exercise should increase your heart rate and make you sweat (moderate-intensity exercise).  Most adults should also do strengthening exercises at least twice a week. This is in addition to the moderate-intensity exercise. Maintain a healthy weight  Body mass index (BMI) is a measurement that can be used to identify possible weight problems. It estimates body fat based on height and weight. Your health care provider can help determine your BMI and help  you achieve or maintain a healthy weight.  For females 24 years of age and older:  A BMI below 18.5 is considered underweight.  A BMI of 18.5 to 24.9 is normal.  A BMI of 25 to 29.9 is considered overweight.  A BMI of 30 and above is considered obese. Watch levels of cholesterol and blood lipids  You should start having your blood tested for lipids and cholesterol at 46 years of age, then have this test every 5 years.  You may need to have your cholesterol levels checked more often if:  Your lipid or cholesterol levels are high.  You are older than 46 years of age.  You are at high risk for heart disease. Cancer screening Lung Cancer  Lung cancer screening is recommended for adults 53-95 years old who are at high risk for lung cancer because of a history of smoking.  A yearly low-dose CT scan  of the lungs is recommended for people who:  Currently smoke.  Have quit within the past 15 years.  Have at least a 30-pack-year history of smoking. A pack year is smoking an average of one pack of cigarettes a day for 1 year.  Yearly screening should continue until it has been 15 years since you quit.  Yearly screening should stop if you develop a health problem that would prevent you from having lung cancer treatment. Breast Cancer  Practice breast self-awareness. This means understanding how your breasts normally appear and feel.  It also means doing regular breast self-exams. Let your health care provider know about any changes, no matter how small.  If you are in your 20s or 30s, you should have a clinical breast exam (CBE) by a health care provider every 1-3 years as part of a regular health exam.  If you are 38 or older, have a CBE every year. Also consider having a breast X-ray (mammogram) every year.  If you have a family history of breast cancer, talk to your health care provider about genetic screening.  If you are at high risk for breast cancer, talk to your health  care provider about having an MRI and a mammogram every year.  Breast cancer gene (BRCA) assessment is recommended for women who have family members with BRCA-related cancers. BRCA-related cancers include:  Breast.  Ovarian.  Tubal.  Peritoneal cancers.  Results of the assessment will determine the need for genetic counseling and BRCA1 and BRCA2 testing. Cervical Cancer  Your health care provider may recommend that you be screened regularly for cancer of the pelvic organs (ovaries, uterus, and vagina). This screening involves a pelvic examination, including checking for microscopic changes to the surface of your cervix (Pap test). You may be encouraged to have this screening done every 3 years, beginning at age 83.  For women ages 29-65, health care providers may recommend pelvic exams and Pap testing every 3 years, or they may recommend the Pap and pelvic exam, combined with testing for human papilloma virus (HPV), every 5 years. Some types of HPV increase your risk of cervical cancer. Testing for HPV may also be done on women of any age with unclear Pap test results.  Other health care providers may not recommend any screening for nonpregnant women who are considered low risk for pelvic cancer and who do not have symptoms. Ask your health care provider if a screening pelvic exam is right for you.  If you have had past treatment for cervical cancer or a condition that could lead to cancer, you need Pap tests and screening for cancer for at least 20 years after your treatment. If Pap tests have been discontinued, your risk factors (such as having a new sexual partner) need to be reassessed to determine if screening should resume. Some women have medical problems that increase the chance of getting cervical cancer. In these cases, your health care provider may recommend more frequent screening and Pap tests. Colorectal Cancer  This type of cancer can be detected and often prevented.  Routine  colorectal cancer screening usually begins at 46 years of age and continues through 46 years of age.  Your health care provider may recommend screening at an earlier age if you have risk factors for colon cancer.  Your health care provider may also recommend using home test kits to check for hidden blood in the stool.  A small camera at the end of a tube can be used to  examine your colon directly (sigmoidoscopy or colonoscopy). This is done to check for the earliest forms of colorectal cancer.  Routine screening usually begins at age 14.  Direct examination of the colon should be repeated every 5-10 years through 46 years of age. However, you may need to be screened more often if early forms of precancerous polyps or small growths are found. Skin Cancer  Check your skin from head to toe regularly.  Tell your health care provider about any new moles or changes in moles, especially if there is a change in a mole's shape or color.  Also tell your health care provider if you have a mole that is larger than the size of a pencil eraser.  Always use sunscreen. Apply sunscreen liberally and repeatedly throughout the day.  Protect yourself by wearing long sleeves, pants, a wide-brimmed hat, and sunglasses whenever you are outside. Heart disease, diabetes, and high blood pressure  High blood pressure causes heart disease and increases the risk of stroke. High blood pressure is more likely to develop in:  People who have blood pressure in the high end of the normal range (130-139/85-89 mm Hg).  People who are overweight or obese.  People who are African American.  If you are 80-47 years of age, have your blood pressure checked every 3-5 years. If you are 87 years of age or older, have your blood pressure checked every year. You should have your blood pressure measured twice-once when you are at a hospital or clinic, and once when you are not at a hospital or clinic. Record the average of the two  measurements. To check your blood pressure when you are not at a hospital or clinic, you can use:  An automated blood pressure machine at a pharmacy.  A home blood pressure monitor.  If you are between 56 years and 16 years old, ask your health care provider if you should take aspirin to prevent strokes.  Have regular diabetes screenings. This involves taking a blood sample to check your fasting blood sugar level.  If you are at a normal weight and have a low risk for diabetes, have this test once every three years after 46 years of age.  If you are overweight and have a high risk for diabetes, consider being tested at a younger age or more often. Preventing infection Hepatitis B  If you have a higher risk for hepatitis B, you should be screened for this virus. You are considered at high risk for hepatitis B if:  You were born in a country where hepatitis B is common. Ask your health care provider which countries are considered high risk.  Your parents were born in a high-risk country, and you have not been immunized against hepatitis B (hepatitis B vaccine).  You have HIV or AIDS.  You use needles to inject street drugs.  You live with someone who has hepatitis B.  You have had sex with someone who has hepatitis B.  You get hemodialysis treatment.  You take certain medicines for conditions, including cancer, organ transplantation, and autoimmune conditions. Hepatitis C  Blood testing is recommended for:  Everyone born from [birth year] through 1966.  Anyone with known risk factors for hepatitis C. Sexually transmitted infections (STIs)  You should be screened for sexually transmitted infections (STIs) including gonorrhea and chlamydia if:  You are sexually active and are younger than 46 years of age.  You are older than 46 years of age and your health care provider tells  you that you are at risk for this type of infection.  Your sexual activity has changed since you were last  screened and you are at an increased risk for chlamydia or gonorrhea. Ask your health care provider if you are at risk.  If you do not have HIV, but are at risk, it may be recommended that you take a prescription medicine daily to prevent HIV infection. This is called pre-exposure prophylaxis (PrEP). You are considered at risk if:  You are sexually active and do not regularly use condoms or know the HIV status of your partner(s).  You take drugs by injection.  You are sexually active with a partner who has HIV. Talk with your health care provider about whether you are at high risk of being infected with HIV. If you choose to begin PrEP, you should first be tested for HIV. You should then be tested every 3 months for as long as you are taking PrEP. Pregnancy  If you are premenopausal and you may become pregnant, ask your health care provider about preconception counseling.  If you may become pregnant, take 400 to 800 micrograms (mcg) of folic acid every day.  If you want to prevent pregnancy, talk to your health care provider about birth control (contraception). Osteoporosis and menopause  Osteoporosis is a disease in which the bones lose minerals and strength with aging. This can result in serious bone fractures. Your risk for osteoporosis can be identified using a bone density scan.  If you are 65 years of age or older, or if you are at risk for osteoporosis and fractures, ask your health care provider if you should be screened.  Ask your health care provider whether you should take a calcium or vitamin D supplement to lower your risk for osteoporosis.  Menopause may have certain physical symptoms and risks.  Hormone replacement therapy may reduce some of these symptoms and risks. Talk to your health care provider about whether hormone replacement therapy is right for you. Follow these instructions at home:  Schedule regular health, dental, and eye exams.  Stay current with your  immunizations.  Do not use any tobacco products including cigarettes, chewing tobacco, or electronic cigarettes.  If you are pregnant, do not drink alcohol.  If you are breastfeeding, limit how much and how often you drink alcohol.  Limit alcohol intake to no more than 1 drink per day for nonpregnant women. One drink equals 12 ounces of beer, 5 ounces of wine, or 1 ounces of hard liquor.  Do not use street drugs.  Do not share needles.  Ask your health care provider for help if you need support or information about quitting drugs.  Tell your health care provider if you often feel depressed.  Tell your health care provider if you have ever been abused or do not feel safe at home. This information is not intended to replace advice given to you by your health care provider. Make sure you discuss any questions you have with your health care provider. Document Released: 08/25/2010 Document Revised: 07/18/2015 Document Reviewed: 11/13/2014  2017 Elsevier

## 2016-01-22 NOTE — Progress Notes (Signed)
[place] 07/31/1970 [id number]    History:    Presents for annual exam. Monthly cycle/vasectomy/2010 ablation. Normal Pap and Mammogram history. History of a small fibroid. Has noticed increased tearfulness and breast tenderness with the onset of her cycle. Diagnosed with DM this past year, managing with Metformin with mild GI side effects. Notes weight gain. Hypertension, hypercholesterolemia,  managed by primary care.  Past medical history, past surgical history, family history and social history were all reviewed and documented in the EPIC chart. 2 sons both grown and married. Recently changed jobs to a less stressful one, currently an Web designer. Husband police officer planning to retire in December. Parents with hypertension  ROS:  A ROS was performed and pertinent positives and negatives are included.  Exam:  Vitals:   01/22/16 1143  BP: 119/80  Weight: 214 lb 6.4 oz (97.3 kg)  Height: 5\' 8"  (1.727 m)   Body mass index is 32.6 kg/m.   General appearance:  Normal Thyroid:  Symmetrical, normal in size, without palpable masses or nodularity. Respiratory  Auscultation:  Clear without wheezing or rhonchi Cardiovascular  Auscultation:  Regular rate, without rubs, murmurs or gallops  Edema/varicosities:  Not grossly evident Abdominal  Soft,nontender, without masses, guarding or rebound.  Liver/spleen:  No organomegaly noted  Hernia:  None appreciated  Skin  Inspection:  Grossly normal   Breasts: Examined lying and sitting.     Right: Without masses, retractions, discharge or axillary adenopathy.     Left: Without masses, retractions, discharge or axillary adenopathy. Gentitourinary   Inguinal/mons:  Normal without inguinal adenopathy  External genitalia:  Normal  BUS/Urethra/Skene's glands:  Normal  Vagina:  Normal  Cervix:  Normal  Uterus: normal in size, shape and contour.  Midline and mobile  Adnexa/parametria:     Rt: Without masses or  tenderness.   Lt: Without masses or tenderness.  Anus and perineum: Normal  Digital rectal exam: Normal sphincter tone without palpated masses or tenderness  Assessment/Plan:  45 y.o.  MWF G2 P2 for annual exam with PMS symptoms of tearfulness.   Light monthly cycles/vasectomy/ablation Small fibroid 1 cm DM 2, hypertension, hypercholesterolemia- managed by primary care    labs and meds Obesity PMS  Plan: Prescription for Prozac10 mg day 14-28 of cycle to help with PMS symptoms. Encouraged increasing leisure activities, yoga to deal with stress. exercise, instructed to call if no relief of symptoms.  SBE's, annual screening mammogram, 3-D encouraged history of dense breasts, exercise, calcium rich diet, vitamin D 1000 daily encouraged. Encouraged to attend diabetes education classes, decrease carbs in diet. Reviewed importance of decreasing calories for weight loss. UA, Pap normal with negative HR HPV typing 2015.      [place], 11:49 AM 01/22/2016

## 2016-02-24 DIAGNOSIS — E119 Type 2 diabetes mellitus without complications: Secondary | ICD-10-CM

## 2016-02-24 HISTORY — DX: Type 2 diabetes mellitus without complications: E11.9

## 2016-06-12 ENCOUNTER — Other Ambulatory Visit: Payer: Self-pay | Admitting: Women's Health

## 2016-06-12 DIAGNOSIS — Z1231 Encounter for screening mammogram for malignant neoplasm of breast: Secondary | ICD-10-CM

## 2016-07-06 ENCOUNTER — Ambulatory Visit
Admission: RE | Admit: 2016-07-06 | Discharge: 2016-07-06 | Disposition: A | Discharge status: Home / Self Care | Payer: 59 | Source: Ambulatory Visit | Attending: Women's Health | Admitting: Women's Health

## 2016-07-06 DIAGNOSIS — Z1231 Encounter for screening mammogram for malignant neoplasm of breast: Secondary | ICD-10-CM

## 2016-07-07 ENCOUNTER — Other Ambulatory Visit: Payer: Self-pay | Admitting: Women's Health

## 2016-07-07 DIAGNOSIS — R928 Other abnormal and inconclusive findings on diagnostic imaging of breast: Secondary | ICD-10-CM

## 2016-07-09 ENCOUNTER — Encounter: Payer: Self-pay | Admitting: Women's Health

## 2016-07-09 ENCOUNTER — Ambulatory Visit
Admission: RE | Admit: 2016-07-09 | Discharge: 2016-07-09 | Disposition: A | Discharge status: Home / Self Care | Payer: 59 | Source: Ambulatory Visit | Attending: Women's Health | Admitting: Women's Health

## 2016-07-09 DIAGNOSIS — R928 Other abnormal and inconclusive findings on diagnostic imaging of breast: Secondary | ICD-10-CM

## 2016-07-16 ENCOUNTER — Encounter: Payer: Self-pay | Admitting: Internal Medicine

## 2016-08-25 ENCOUNTER — Ambulatory Visit: Payer: 59 | Admitting: Nurse Practitioner

## 2016-08-31 ENCOUNTER — Ambulatory Visit (INDEPENDENT_AMBULATORY_CARE_PROVIDER_SITE_OTHER): Payer: 59 | Admitting: Gastroenterology

## 2016-08-31 ENCOUNTER — Encounter: Payer: Self-pay | Admitting: Gastroenterology

## 2016-08-31 DIAGNOSIS — K219 Gastro-esophageal reflux disease without esophagitis: Secondary | ICD-10-CM | POA: Diagnosis not present

## 2016-08-31 MED ORDER — PANTOPRAZOLE SODIUM 40 MG PO TBEC: 40.0000 mg | DELAYED_RELEASE_TABLET | Freq: Two times a day (BID) | ORAL | 3 refills | Status: DC

## 2016-08-31 MED ORDER — PANTOPRAZOLE SODIUM 40 MG PO TBEC: 40.0000 mg | DELAYED_RELEASE_TABLET | Freq: Two times a day (BID) | ORAL | Status: DC

## 2016-08-31 NOTE — Progress Notes (Deleted)
Primary Care Physician:  Glenda Chroman, MD Primary Gastroenterologist:  Dr.   Laurel Dimmer Complaint  Patient presents with  . Gastroesophageal Reflux    HPI:   Allison Patterson is a 46 y.o. female presenting today at the request of   Past Medical History:  Diagnosis Date  . Diabetes ([place]) JAN 2018  . Hirsutism   . Hypertension   . Reflux     Past Surgical History:  Procedure Laterality Date  . ENDOMETRIAL ABLATION  05/2008  . HYSTEROSCOPY      Current Outpatient Prescriptions  Medication Sig Dispense Refill  . Atorvastatin Calcium (LIPITOR PO) Take 20 mg by mouth daily.     . hydrochlorothiazide (HYDRODIURIL) 25 MG tablet Take 25 mg by mouth daily.    Marland Kitchen METFORMIN HCL PO Take 500 mg by mouth 2 (two) times daily.     . metoprolol succinate (TOPROL-XL) 50 MG 24 hr tablet Take 50 mg by mouth daily. Take with or immediately following a meal.    . pantoprazole (PROTONIX) 40 MG tablet Take 40 mg by mouth daily.    . ciprofloxacin (CIPRO) 250 MG tablet Take 1 tablet (250 mg total) by mouth 2 (two) times daily. (Patient not taking: Reported on 01/22/2016) 6 tablet 0  . FLUoxetine (PROZAC) 10 MG capsule Take 1 capsule (10 mg total) by mouth daily. Take day 14-28 of each cycle (Patient not taking: Reported on 08/31/2016) 30 capsule 12   No current facility-administered medications for this visit.     Allergies as of 08/31/2016  . (No Known Allergies)    Family History  Problem Relation Age of Onset  . Hypertension Mother   . Hypertension Father   . Hypertension Brother   . Heart disease Maternal Grandmother   . Breast cancer Maternal Grandmother        BREAST CANCER-Age 22's  . Cancer Maternal Grandfather        LUNG CA  . Cancer Paternal Grandmother        BRAIN CA    Social History   Social History  . Marital status: Married    Spouse name: N/A  . Number of children: 2  . Years of education: N/A   Occupational History  . 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 Smoking status: Never Smoker  . Smokeless tobacco: Never Used  . Alcohol use No  . Drug use: No  . Sexual activity: Yes    Birth control/ protection: Other-see comments     Comment: Vasectomy, INTERCOURSE AGE 57, SEXUAL PARTNERS LESS THAN 5   Other Topics Concern  . Not on file   Social History Narrative  . No narrative on file    Review of Systems: Gen: Denies any fever, chills, fatigue, weight loss, lack of appetite.  CV: Denies chest pain, heart palpitations, peripheral edema, syncope.  Resp: Denies shortness of breath at rest or with exertion. Denies wheezing or cough.  GI: Denies dysphagia or odynophagia. Denies jaundice, hematemesis, fecal incontinence. GU : Denies urinary burning, urinary frequency, urinary hesitancy MS: Denies joint pain, muscle weakness, cramps, or limitation of movement.  Derm: Denies rash, itching, dry skin Psych: Denies depression, anxiety, memory loss, and confusion Heme: Denies bruising, bleeding, and enlarged lymph nodes.  Physical Exam: BP 127/85   Pulse (!) 115   Temp 98.3 F (36.8 C) (Oral)   Ht 5\' 9"  (1.753 m)   Wt 217 lb 3.2 oz (98.5 kg)  BMI 32.07 kg/m  General:   Alert and oriented. Pleasant and cooperative. Well-nourished and well-developed.  Head:  Normocephalic and atraumatic. Eyes:  Without icterus, sclera clear and conjunctiva pink.  Ears:  Normal auditory acuity. Nose:  No deformity, discharge,  or lesions. Mouth:  No deformity or lesions, oral mucosa pink.  Neck:  Supple, without mass or thyromegaly. Lungs:  Clear to auscultation bilaterally. No wheezes, rales, or rhonchi. No distress.  Heart:  S1, S2 present without murmurs appreciated.  Abdomen:  +BS, soft, non-tender and non-distended. No HSM noted. No guarding or rebound. No masses appreciated.  Rectal:  Deferred  Msk:  Symmetrical without gross deformities. Normal posture. Pulses:  Normal pulses noted. Extremities:  Without clubbing or  edema. Neurologic:  Alert and  oriented x4;  grossly normal neurologically. Skin:  Intact without significant lesions or rashes. Cervical Nodes:  No significant cervical adenopathy. Psych:  Alert and cooperative. Normal mood and affect.

## 2016-08-31 NOTE — Progress Notes (Signed)
Primary Care Physician:  Glenda Chroman, MD Primary Gastroenterologist:  Dr. Gala Romney   Chief Complaint  Patient presents with  . Gastroesophageal Reflux    HPI:   Allison Patterson is a 46 y.o. female presenting today at the request of her PCP and self-referral (her dad is also a patient here), with GERD.   Waking up with nocturnal reflux approximately once a week or so. On Protonix for many years. Has also been on Nexium. Has a cough that she can't get rid of for at least 5-6 months. Sometimes with pill dysphagia but no solid food dysphagia. States most of the time reflux is fine, but nighttime symptoms are worse. No abdominal pain. Has intermittent nausea but no vomiting. No unexplained weight loss or lack of appetite. Taking Protonix around midday, usually forgetful. Taking Zantac at night occasionally. Not eating late at night. Drinks a lot of [employer]. Eating the "wrong things" patient states. Feels diet is playing a large role.   No prior colonoscopy or EGD.   Past Medical History:  Diagnosis Date  . Diabetes ([place]) JAN 2018  . Hirsutism   . Hypertension   . Reflux     Past Surgical History:  Procedure Laterality Date  . ENDOMETRIAL ABLATION  05/2008  . HYSTEROSCOPY      Current Outpatient Prescriptions  Medication Sig Dispense Refill  . Atorvastatin Calcium (LIPITOR PO) Take 20 mg by mouth daily.     . hydrochlorothiazide (HYDRODIURIL) 25 MG tablet Take 25 mg by mouth daily.    Marland Kitchen METFORMIN HCL PO Take 500 mg by mouth 2 (two) times daily.     . metoprolol succinate (TOPROL-XL) 50 MG 24 hr tablet Take 50 mg by mouth daily. Take with or immediately following a meal.    . pantoprazole (PROTONIX) 40 MG tablet Take 1 tablet (40 mg total) by mouth 2 (two) times daily before a meal. 60 tablet 3  . pantoprazole (PROTONIX) 40 MG tablet Take 1 tablet (40 mg total) by mouth 2 (two) times daily before a meal. 60 tablet 3.   No current facility-administered medications for  this visit.     Allergies as of 08/31/2016  . (No Known Allergies)    Family History  Problem Relation Age of Onset  . Hypertension Mother   . Hypertension Father   . Hypertension Brother   . Heart disease Maternal Grandmother   . Breast cancer Maternal Grandmother        BREAST CANCER-Age 65's  . Cancer Maternal Grandfather        LUNG CA  . Cancer Paternal Grandmother        BRAIN CA  . Colon cancer Neg Hx   . Colon polyps Neg Hx     Social History   Social History  . Marital status: Married    Spouse name: N/A  . Number of children: 2  . Years of education: N/A   Occupational History  . 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 Smoking status: Never Smoker  . Smokeless tobacco: Never Used  . Alcohol use No  . Drug use: No  . Sexual activity: Yes    Birth control/ protection: Other-see comments     Comment: Vasectomy, INTERCOURSE AGE 71, SEXUAL PARTNERS LESS THAN 5   Other Topics Concern  . Not on file   Social History Narrative  . No narrative on file    Review of Systems: Gen: Denies any fever,  chills, fatigue, weight loss, lack of appetite.  CV: Denies chest pain, heart palpitations, peripheral edema, syncope.  Resp: Denies shortness of breath at rest or with exertion. Denies wheezing or cough.  GI: see HPI  GU : Denies urinary burning, urinary frequency, urinary hesitancy MS: Denies joint pain, muscle weakness, cramps, or limitation of movement.  Derm: Denies rash, itching, dry skin Psych: Denies depression, anxiety, memory loss, and confusion Heme: Denies bruising, bleeding, and enlarged lymph nodes.  Physical Exam: BP 127/85   Pulse (!) 115   Temp 98.3 F (36.8 C) (Oral)   Ht 5\' 9"  (1.753 m)   Wt 217 lb 3.2 oz (98.5 kg)   BMI 32.07 kg/m  General:   Alert and oriented. Pleasant and cooperative. Well-nourished and well-developed.  Head:  Normocephalic and atraumatic. Eyes:  Without icterus, sclera clear and conjunctiva pink.    Ears:  Normal auditory acuity. Nose:  No deformity, discharge,  or lesions. Mouth:  No deformity or lesions, oral mucosa pink. Lungs:  Clear to auscultation bilaterally. No wheezes, rales, or rhonchi. No distress.  Heart:  S1, S2 present without murmurs appreciated.  Abdomen:  +BS, soft, non-tender and non-distended. No HSM noted. No guarding or rebound. No masses appreciated.  Rectal:  Deferred  Msk:  Symmetrical without gross deformities. Normal posture. Extremities:  Without edema. Neurologic:  Alert and  oriented x4;  grossly normal neurologically. Psych:  Alert and cooperative. Normal mood and affect.

## 2016-08-31 NOTE — Patient Instructions (Addendum)
Let's increase Protonix to 30 minutes before breakfast and dinner for 1 month.   Take Zantac as needed at bedtime.  Follow the dietary recommendations on the handout provided.  We will see how you do with this!  I will see you in 4-6 weeks!   Food Choices for Gastroesophageal Reflux Disease, Adult When you have gastroesophageal reflux disease (GERD), the foods you eat and your eating habits are very important. Choosing the right foods can help ease the discomfort of GERD. Consider working with a diet and nutrition specialist (dietitian) to help you make healthy food choices. What general guidelines should I follow? Eating plan  Choose healthy foods low in fat, such as fruits, vegetables, whole grains, low-fat dairy products, and lean meat, fish, and poultry.  Eat frequent, small meals instead of three large meals each day. Eat your meals slowly, in a relaxed setting. Avoid bending over or lying down until 2-3 hours after eating.  Limit high-fat foods such as fatty meats or fried foods.  Limit your intake of oils, butter, and shortening to less than 8 teaspoons each day.  Avoid the following: ? Foods that cause symptoms. These may be different for different people. Keep a food diary to keep track of foods that cause symptoms. ? Alcohol. ? Drinking large amounts of liquid with meals. ? Eating meals during the 2-3 hours before bed.  Cook foods using methods other than frying. This may include baking, grilling, or broiling. Lifestyle   Maintain a healthy weight. Ask your health care provider what weight is healthy for you. If you need to lose weight, work with your health care provider to do so safely.  Exercise for at least 30 minutes on 5 or more days each week, or as told by your health care provider.  Avoid wearing clothes that fit tightly around your waist and chest.  Do not use any products that contain nicotine or tobacco, such as cigarettes and e-cigarettes. If you need  help quitting, ask your health care provider.  Sleep with the head of your bed raised. Use a wedge under the mattress or blocks under the bed frame to raise the head of the bed. What foods are not recommended? The items listed may not be a complete list. Talk with your dietitian about what dietary choices are best for you. Grains Pastries or quick breads with added fat. [place] toast. Vegetables Deep fried vegetables. [place] fries. Any vegetables prepared with added fat. Any vegetables that cause symptoms. For some people this may include tomatoes and tomato products, chili peppers, onions and garlic, and horseradish. Fruits Any fruits prepared with added fat. Any fruits that cause symptoms. For some people this may include citrus fruits, such as oranges, grapefruit, pineapple, and lemons. Meats and other protein foods High-fat meats, such as fatty beef or pork, hot dogs, ribs, ham, sausage, salami and bacon. Fried meat or protein, including fried fish and fried chicken. Nuts and nut butters. Dairy Whole milk and chocolate milk. Sour cream. Cream. Ice cream. Cream cheese. Milk shakes. Beverages Coffee and tea, with or without caffeine. Carbonated beverages. Sodas. Energy drinks. Fruit juice made with acidic fruits (such as orange or grapefruit). Tomato juice. Alcoholic drinks. Fats and oils Butter. Margarine. Shortening. Ghee. Sweets and desserts Chocolate and cocoa. Donuts. Seasoning and other foods Pepper. Peppermint and spearmint. Any condiments, herbs, or seasonings that cause symptoms. For some people, this may include curry, hot sauce, or vinegar-based salad dressings. Summary  When you have gastroesophageal reflux disease (  GERD), food and lifestyle choices are very important to help ease the discomfort of GERD.  Eat frequent, small meals instead of three large meals each day. Eat your meals slowly, in a relaxed setting. Avoid bending over or lying down until 2-3 hours after  eating.  Limit high-fat foods such as fatty meat or fried foods. This information is not intended to replace advice given to you by your health care provider. Make sure you discuss any questions you have with your health care provider. Document Released: 02/09/2005 Document Revised: 02/11/2016 Document Reviewed: 02/11/2016 Elsevier Interactive Patient Education  2017 [employer].

## 2016-09-01 NOTE — Assessment & Plan Note (Signed)
46 year old female with chronic GERD, intermittent nocturnal exacerbation, chronic cough. Likely related to dietary and behavior. Discussed increasing Protonix to BID just for one month, then returning to once daily. Take zantac only as needed at bedtime. Dietary and behavior modifications discussed in detail. No alarm features. 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 May need EGD if persistent issues despite aggressive changes. Return in 6 weeks to reassess.

## 2016-09-02 NOTE — Progress Notes (Signed)
CC'D TO PCP °

## 2016-09-24 ENCOUNTER — Encounter: Payer: Self-pay | Admitting: Gastroenterology

## 2016-10-23 ENCOUNTER — Ambulatory Visit: Payer: 59 | Admitting: Gastroenterology

## 2016-11-16 ENCOUNTER — Ambulatory Visit: Payer: 59 | Admitting: Gastroenterology

## 2017-01-25 ENCOUNTER — Encounter: Payer: Self-pay | Admitting: Women's Health

## 2017-01-25 ENCOUNTER — Ambulatory Visit (INDEPENDENT_AMBULATORY_CARE_PROVIDER_SITE_OTHER): Payer: 59 | Admitting: Women's Health

## 2017-01-25 VITALS — BP 122/78 | Ht 68.0 in | Wt 222.0 lb

## 2017-01-25 DIAGNOSIS — Z01419 Encounter for gynecological examination (general) (routine) without abnormal findings: Secondary | ICD-10-CM

## 2017-01-25 DIAGNOSIS — N952 Postmenopausal atrophic vaginitis: Secondary | ICD-10-CM | POA: Diagnosis not present

## 2017-01-25 MED ORDER — ESTRADIOL 10 MCG VA TABS: ORAL_TABLET | VAGINAL | 11 refills | Status: DC

## 2017-01-25 NOTE — Patient Instructions (Signed)

## 2017-01-25 NOTE — Progress Notes (Signed)
Allison Patterson Jul 29, 1970 [id number]    History:    Presents for annual exam. Cycles irregular, every 2-3 months. Cycles had been monthly, 2010 endometrial ablation, vasectomy. Normal Pap and mammogram history. Primary care manages diabetes, hypercholesterolemia. Recurrent UTIs in this past year has seen a urologist related to questionable perimenopausal status.  Past medical history, past surgical history, family history and social history were all reviewed and documented in the EPIC chart. 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 Parents hypertension.   ROS:  A ROS was performed and pertinent positives and negatives are included.  Exam:  Vitals:   01/25/17 0825  BP: 122/78  Weight: 222 lb (100.7 kg)  Height: 5\' 8"  (1.727 m)   Body mass index is 33.75 kg/m.   General appearance:  Normal Thyroid:  Symmetrical, normal in size, without palpable masses or nodularity. Respiratory  Auscultation:  Clear without wheezing or rhonchi Cardiovascular  Auscultation:  Regular rate, without rubs, murmurs or gallops  Edema/varicosities:  Not grossly evident Abdominal  Soft,nontender, without masses, guarding or rebound.  Liver/spleen:  No organomegaly noted  Hernia:  None appreciated  Skin  Inspection:  Grossly normal   Breasts: Examined lying and sitting.     Right: Without masses, retractions, discharge or axillary adenopathy.     Left: Without masses, retractions, discharge or axillary adenopathy. Gentitourinary   Inguinal/mons:  Normal without inguinal adenopathy  External genitalia:  Normal  BUS/Urethra/Skene's glands:  Normal  Vagina:  Normal  Cervix:  Normal  Uterus:   normal in size, shape and contour.  Midline and mobile  Adnexa/parametria:     Rt: Without masses or tenderness.   Lt: Without masses or tenderness.  Anus and perineum: Normal  Digital rectal exam: Normal sphincter tone without  palpated masses or tenderness  Assessment/Plan:  46 y.o. MWF G2 P2  for annual exam with history of recurrent UTIs .   Perimenopausal/irregular cycles/2010 endometrial ablation Husband vasectomy Obesity Diabetes/hypercholesterolemia-primary care manages labs and meds Recurrent UTIs in past year  Plan: SBE's, continue annual screening mammogram, calcium rich diet, vitamin D 1000 daily encouraged. Reviewed importance of increasing exercise and decreasing calories/carbs. Vagifem 1 applicator per vagina for 2 weeks and then twice weekly thereafter. UTI prevention discussed. Pap with HR HPV typing, new screening guidelines reviewed.  [place], 1:05 PM 01/25/2017

## 2017-01-27 LAB — PAP, TP IMAGING W/ HPV RNA, RFLX HPV TYPE 16,18/45: HPV DNA High Risk: NOT DETECTED

## 2017-08-20 ENCOUNTER — Other Ambulatory Visit: Payer: Self-pay | Admitting: Women's Health

## 2017-08-20 DIAGNOSIS — Z1231 Encounter for screening mammogram for malignant neoplasm of breast: Secondary | ICD-10-CM

## 2017-09-14 ENCOUNTER — Ambulatory Visit
Admission: RE | Admit: 2017-09-14 | Discharge: 2017-09-14 | Disposition: A | Discharge status: Home / Self Care | Payer: 59 | Source: Ambulatory Visit | Attending: Women's Health | Admitting: Women's Health

## 2017-09-14 DIAGNOSIS — Z1231 Encounter for screening mammogram for malignant neoplasm of breast: Secondary | ICD-10-CM

## 2017-09-15 ENCOUNTER — Encounter (INDEPENDENT_AMBULATORY_CARE_PROVIDER_SITE_OTHER): Payer: Self-pay

## 2017-11-16 ENCOUNTER — Encounter: Payer: Self-pay | Admitting: Internal Medicine

## 2017-11-24 ENCOUNTER — Telehealth: Payer: Self-pay

## 2017-11-24 ENCOUNTER — Encounter: Payer: Self-pay | Admitting: Nurse Practitioner

## 2017-11-24 ENCOUNTER — Other Ambulatory Visit: Payer: Self-pay

## 2017-11-24 ENCOUNTER — Ambulatory Visit (INDEPENDENT_AMBULATORY_CARE_PROVIDER_SITE_OTHER): Payer: 59 | Admitting: Nurse Practitioner

## 2017-11-24 VITALS — BP 118/82 | HR 80 | Temp 97.4°F | Ht 69.0 in | Wt 208.2 lb

## 2017-11-24 DIAGNOSIS — R05 Cough: Secondary | ICD-10-CM | POA: Diagnosis not present

## 2017-11-24 DIAGNOSIS — K219 Gastro-esophageal reflux disease without esophagitis: Secondary | ICD-10-CM

## 2017-11-24 DIAGNOSIS — R053 Chronic cough: Secondary | ICD-10-CM | POA: Insufficient documentation

## 2017-11-24 DIAGNOSIS — K5900 Constipation, unspecified: Secondary | ICD-10-CM

## 2017-11-24 MED ORDER — RANITIDINE HCL 150 MG PO TABS: 150.0000 mg | ORAL_TABLET | Freq: Every day | ORAL | 3 refills | Status: DC

## 2017-11-24 NOTE — Telephone Encounter (Signed)
PA for EGD submitted via [facility] website. Notification/prior authorization reference number is [id number].

## 2017-11-24 NOTE — Assessment & Plan Note (Signed)
Chronic history of GERD previously on Protonix once daily, Protonix twice daily, Protonix once daily with Zantac once daily in the evening.  She is currently back on Protonix once daily.  Multiple changes in her medications have not resulted in improvement in her cough.  Her other GERD symptoms are well controlled.  She will have a noticeable worsening of her typical GERD symptoms (esophageal burning, bitter sour taste, choking on bile) if she misses her PPI.  I feel her cough is likely not related to GERD.  However, given her persistent symptoms we will proceed with upper endoscopy to evaluate for any worsening symptoms on medications.  Follow-up in 3 months.  Proceed with EGD with Dr. Gala Romney in near future: the risks, benefits, and alternatives have been discussed with the patient in detail. The patient states understanding and desires to proceed.  The patient is not on any anticoagulants, anxiolytics, chronic pain medications, or antidepressants.  Conscious sedation should be adequate for her procedure.

## 2017-11-24 NOTE — Progress Notes (Signed)
Referring Provider: Glenda Chroman, MD Primary Care Physician:  Glenda Chroman, MD Primary GI:  Dr. Gala Romney  Chief Complaint  Patient presents with  . Gastroesophageal Reflux    has a cough that is inteferring with everything. She has had cough for years  . Nausea    no vomiting    HPI:   Allison Patterson is a 47 y.o. female who presents for follow-up on GERD.  Patient was last seen in our office 08/31/2016 for the same.  At that time as noted she wakes with nocturnal reflux once a week, on Protonix for many years and is also tried Nexium.  Chronic cough for about 6 months.  Sometimes pill dysphagia but no solid food dysphagia.  Most of the time reflux is fine but nighttime symptoms are bothersome.  Intermittent nausea, no vomiting.  Takes Protonix from midday, usually forgetful.  Takes Zantac at night occasionally.  Drinks a lot of sodas, eats "the wrong things" and feels diet plays a large role.  No previous colonoscopy or EGD in our system.  Recommended increase Protonix to twice daily for 1 month, Zantac as needed at bedtime, dietary recommendations provided, follow-up in 4 to 6 weeks.  Today she states she's doing ok overall. She was on pantoprazole twice a day. Her PCP added Zantac in the evenings and reduce her Protonix to once a day. She states this was done because she didn't notice a difference on bid Protonix.  Her Zantac ran out and a refill was not provided so she is currently back on Protonix daily. She also didn't notice much difference with Zantac. Her primary symptoms is a persistent, bothersome cough and occasional nausea (no vomiting). If she missed her PPI she will have "horrible" night-time issues. She has been previously worked up for chronic cough including chest XRay (which she states was normal). Her husband has told her she seems to cough more after eating. If she takes her medication, she doesn't note esophageal burning and bitter taste. She also admits some bloating  which is intermittent. No known triggers (but hasn't really paid attention). Denies abdominal pain, hematochezia, melena, fever, chills, unintentional weight loss. Chronic lower GI symptoms, will go as long as a week or two between bowel movements that are typically loose. Occasional constipation not associated with pain. When she does go a long time between bowel movement sshe typically passes a hard stool that is followed by several loose stools. Denies chest pain, dyspnea, dizziness, lightheadedness, syncope, near syncope. Denies any other upper or lower GI symptoms.  Denies seasonal allergies.   Past Medical History:  Diagnosis Date  . Diabetes ([place]) JAN 2018  . Elevated cholesterol   . Hirsutism   . Hypertension   . Reflux     Past Surgical History:  Procedure Laterality Date  . ENDOMETRIAL ABLATION  05/2008  . HYSTEROSCOPY      Current Outpatient Medications  Medication Sig Dispense Refill  . Atorvastatin Calcium (LIPITOR PO) Take 20 mg by mouth daily.     . hydrochlorothiazide (HYDRODIURIL) 25 MG tablet Take 25 mg by mouth daily.    Marland Kitchen METFORMIN HCL PO Take 500 mg by mouth 2 (two) times daily.     . metoprolol succinate (TOPROL-XL) 50 MG 24 hr tablet Take 50 mg by mouth daily. Take with or immediately following a meal.    . pantoprazole (PROTONIX) 40 MG tablet Take 1 tablet (40 mg total) by mouth 2 (two) times daily before a  meal. (Patient taking differently: Take 40 mg by mouth daily. ) 60 tablet 3   No current facility-administered medications for this visit.     Allergies as of 11/24/2017  . (No Known Allergies)    Family History  Problem Relation Age of Onset  . Hypertension Mother   . Hypertension Father   . Hypertension Brother   . Heart disease Maternal Grandmother   . Breast cancer Maternal Grandmother        BREAST CANCER-Age 23's  . Cancer Maternal Grandfather        LUNG CA  . Cancer Paternal Grandmother        BRAIN CA  . Colon cancer Neg Hx   . Colon  polyps Neg Hx     Social History   Socioeconomic History  . Marital status: Married    Spouse name: Not on file  . Number of children: 2  . Years of education: Not on file  . Highest education level: Not on file  Occupational History  . Occupation: Freight forwarder at [employer]  . Financial resource strain: Not on file  . Food insecurity:    Worry: Not on file    Inability: Not on file  . Transportation needs:    Medical: Not on file    Non-medical: Not on file  Tobacco Use  . Smoking status: Never Smoker  . Smokeless tobacco: Never Used  Substance and Sexual Activity  . Alcohol use: No    Alcohol/week: 0.0 standard drinks  . Drug use: No  . Sexual activity: Yes    Birth control/protection: Other-see comments    Comment: Vasectomy, INTERCOURSE AGE 39, SEXUAL PARTNERS LESS THAN 5  Lifestyle  . Physical activity:    Days per week: Not on file    Minutes per session: Not on file  . Stress: Not on file  Relationships  . Social connections:    Talks on phone: Not on file    Gets together: Not on file    Attends religious service: Not on file    Active member of club or organization: Not on file    Attends meetings of clubs or organizations: Not on file    Relationship status: Not on file  Other Topics Concern  . Not on file  Social History Narrative  . Not on file    Review of Systems: General: Negative for anorexia, weight loss, fever, chills, fatigue, weakness. ENT: Negative for hoarseness, difficulty swallowing. CV: Negative for chest pain, angina, palpitations, peripheral edema.  Respiratory: Negative for dyspnea at rest, cough, sputum, wheezing.  GI: See history of present illness. Endo: Negative for unusual weight change.  Heme: Negative for bruising or bleeding.  Physical Exam: BP 118/82   Pulse 80   Temp (!) 97.4 F (36.3 C) (Oral)   Ht 5\' 9"  (1.753 m)   Wt 208 lb 3.2 oz (94.4 kg)   LMP 11/15/2017   BMI 30.75 kg/m  General:   Alert and  oriented. Pleasant and cooperative. Well-nourished and well-developed.  Eyes:  Without icterus, sclera clear and conjunctiva pink.  Ears:  Normal auditory acuity. Cardiovascular:  S1, S2 present without murmurs appreciated. Extremities without clubbing or edema. Respiratory:  Clear to auscultation bilaterally. No wheezes, rales, or rhonchi. No distress.  Gastrointestinal:  +BS, soft, non-tender and non-distended. No HSM noted. No guarding or rebound. No masses appreciated.  Rectal:  Deferred  Musculoskalatal:  Symmetrical without gross deformities. Skin:  Intact without significant lesions or rashes. Neurologic:  Alert and oriented x4;  grossly normal neurologically. Psych:  Alert and cooperative. Normal mood and affect. Heme/Lymph/Immune: No excessive bruising noted.    11/24/2017 10:03 AM   Disclaimer: This note was dictated with voice recognition software. Similar sounding words can inadvertently be transcribed and may not be corrected upon review.

## 2017-11-24 NOTE — Assessment & Plan Note (Signed)
Constipation is a likely culprit of her irregular bowel movements.  She will typically go as long as 1 week without a bowel movement.  When she does have a bowel movement she typically passes a hard stool which is followed by multiple loose bowel movements.  She does not generally have abdominal pain associated with this.  On exam, however, she did have mild lower abdominal tenderness.  She is not wanting a prescription medication at this time.  She states it is not very bothersome.  I will make recommendations related to increased fiber, water and use of over-the-counter stool softener daily.  Continue other current medications.  Follow-up in 3 months.

## 2017-11-24 NOTE — Assessment & Plan Note (Signed)
The patient's primary concerning symptom is a chronic cough which is persistent despite GERD management on a couple different PPIs as well as an H2 receptor blocker and resolution of other GERD symptoms, as per above.  It is very possible that her chronic cough is not reflux in nature.  We will proceed with an upper endoscopy to further evaluate and essentially rapid upper GI evaluation related to GERD as a culprit for her cough, as per above.  She may require a referral to pulmonology or further work-up by primary care.  Continue other medications.  Follow-up in 3 months.

## 2017-11-24 NOTE — Progress Notes (Signed)
CC'D TO PCP °

## 2017-11-24 NOTE — Patient Instructions (Signed)
1. We will schedule your upper endoscopy for you. 2. Further recommendations will be made based on the results of your endoscopy. 3. Make sure you are eating plenty of fibrous foods such as fruits, vegetables, whole grains.  Make sure you are drinking enough water every day. 4. He can start taking Colace (stool softener) which is available over-the-counter.  You can take this once a day to help have regular bowel movements. 5. Continue your other medications. 6. I refilled your Zantac for you. 7. Return for follow-up in 3 months. 8. Call us if you have any questions or concerns.  At [facility] we value your feedback. You may receive a survey about your visit today. Please share your experience as we strive to create trusting relationships with our patients to provide genuine, compassionate, quality care.  We appreciate your understanding and patience as we review any laboratory studies, imaging, and other diagnostic tests that are ordered as we care for you. Our office policy is 5 business days for review of these results, and any emergent or urgent results are addressed in a timely manner for your best interest. If you do not hear from our office in 1 week, please contact us.   We also encourage the use of MyChart, which contains your medical information for your review as well. If you are not enrolled in this feature, an access code is on this after visit summary for your convenience. Thank you for allowing [place] to be involved in your care.  It was great to meet you today!  I hope you have a great Fall!!

## 2017-11-25 NOTE — Telephone Encounter (Signed)
Case approved per [facility] website.

## 2018-01-05 ENCOUNTER — Encounter (HOSPITAL_COMMUNITY): Payer: Self-pay | Admitting: *Deleted

## 2018-01-05 ENCOUNTER — Other Ambulatory Visit: Payer: Self-pay

## 2018-01-05 ENCOUNTER — Ambulatory Visit (HOSPITAL_COMMUNITY)
Admission: RE | Admit: 2018-01-05 | Discharge: 2018-01-05 | Disposition: A | Discharge status: Home / Self Care | Payer: 59 | Source: Ambulatory Visit | Attending: Internal Medicine | Admitting: Internal Medicine

## 2018-01-05 ENCOUNTER — Encounter (HOSPITAL_COMMUNITY)
Admission: RE | Disposition: A | Discharge status: Home / Self Care | Payer: Self-pay | Source: Ambulatory Visit | Attending: Internal Medicine

## 2018-01-05 DIAGNOSIS — E7800 Pure hypercholesterolemia, unspecified: Secondary | ICD-10-CM | POA: Insufficient documentation

## 2018-01-05 DIAGNOSIS — L680 Hirsutism: Secondary | ICD-10-CM | POA: Insufficient documentation

## 2018-01-05 DIAGNOSIS — E119 Type 2 diabetes mellitus without complications: Secondary | ICD-10-CM | POA: Insufficient documentation

## 2018-01-05 DIAGNOSIS — Z808 Family history of malignant neoplasm of other organs or systems: Secondary | ICD-10-CM | POA: Diagnosis not present

## 2018-01-05 DIAGNOSIS — Z801 Family history of malignant neoplasm of trachea, bronchus and lung: Secondary | ICD-10-CM | POA: Insufficient documentation

## 2018-01-05 DIAGNOSIS — Z7984 Long term (current) use of oral hypoglycemic drugs: Secondary | ICD-10-CM | POA: Diagnosis not present

## 2018-01-05 DIAGNOSIS — I10 Essential (primary) hypertension: Secondary | ICD-10-CM | POA: Diagnosis not present

## 2018-01-05 DIAGNOSIS — R05 Cough: Secondary | ICD-10-CM | POA: Diagnosis not present

## 2018-01-05 DIAGNOSIS — Z79899 Other long term (current) drug therapy: Secondary | ICD-10-CM | POA: Insufficient documentation

## 2018-01-05 DIAGNOSIS — K317 Polyp of stomach and duodenum: Secondary | ICD-10-CM

## 2018-01-05 DIAGNOSIS — R053 Chronic cough: Secondary | ICD-10-CM

## 2018-01-05 DIAGNOSIS — R12 Heartburn: Secondary | ICD-10-CM | POA: Diagnosis present

## 2018-01-05 DIAGNOSIS — K219 Gastro-esophageal reflux disease without esophagitis: Secondary | ICD-10-CM | POA: Diagnosis not present

## 2018-01-05 DIAGNOSIS — Z8249 Family history of ischemic heart disease and other diseases of the circulatory system: Secondary | ICD-10-CM | POA: Insufficient documentation

## 2018-01-05 DIAGNOSIS — Z803 Family history of malignant neoplasm of breast: Secondary | ICD-10-CM | POA: Insufficient documentation

## 2018-01-05 DIAGNOSIS — K449 Diaphragmatic hernia without obstruction or gangrene: Secondary | ICD-10-CM | POA: Diagnosis not present

## 2018-01-05 HISTORY — PX: POLYPECTOMY: SHX5525

## 2018-01-05 HISTORY — PX: ESOPHAGOGASTRODUODENOSCOPY: SHX5428

## 2018-01-05 SURGERY — EGD (ESOPHAGOGASTRODUODENOSCOPY)
Anesthesia: Moderate Sedation | Site: Abdomen

## 2018-01-05 MED ORDER — LIDOCAINE VISCOUS HCL 2 % MT SOLN
OROMUCOSAL | Status: AC
  Filled 2018-01-05: qty 15

## 2018-01-05 MED ORDER — MIDAZOLAM HCL 5 MG/5ML IJ SOLN
INTRAMUSCULAR | Status: AC
  Filled 2018-01-05: qty 10

## 2018-01-05 MED ORDER — MEPERIDINE HCL 100 MG/ML IJ SOLN
INTRAMUSCULAR | Status: DC | PRN
  Administered 2018-01-05: 50 mg via INTRAVENOUS

## 2018-01-05 MED ORDER — ONDANSETRON HCL 4 MG/2ML IJ SOLN
INTRAMUSCULAR | Status: DC | PRN
  Administered 2018-01-05: 4 mg via INTRAVENOUS

## 2018-01-05 MED ORDER — MIDAZOLAM HCL 5 MG/5ML IJ SOLN
INTRAMUSCULAR | Status: DC | PRN
  Administered 2018-01-05 (×2): 2 mg via INTRAVENOUS

## 2018-01-05 MED ORDER — MEPERIDINE HCL 50 MG/ML IJ SOLN
INTRAMUSCULAR | Status: AC
  Filled 2018-01-05: qty 1

## 2018-01-05 MED ORDER — STERILE WATER FOR IRRIGATION IR SOLN
Status: DC | PRN
  Administered 2018-01-05: 2.5 mL

## 2018-01-05 MED ORDER — SODIUM CHLORIDE 0.9 % IV SOLN
INTRAVENOUS | Status: DC
  Administered 2018-01-05: 10:00:00 via INTRAVENOUS

## 2018-01-05 MED ORDER — LIDOCAINE VISCOUS HCL 2 % MT SOLN
OROMUCOSAL | Status: DC | PRN
  Administered 2018-01-05: 5 mL via OROMUCOSAL

## 2018-01-05 MED ORDER — ONDANSETRON HCL 4 MG/2ML IJ SOLN
INTRAMUSCULAR | Status: AC
  Filled 2018-01-05: qty 2

## 2018-01-05 NOTE — H&P (Signed)
@LOGO @   Primary Care Physician:  Glenda Chroman, MD Primary Gastroenterologist:  Dr. Gala Romney  Pre-Procedure History & Physical: HPI:  Allison Patterson is a 47 y.o. female here for GERD and coffee EGD.  Patient denies dysphagia.  Past Medical History:  Diagnosis Date  . Diabetes ([place]) JAN 2018  . Elevated cholesterol   . Hirsutism   . Hypertension   . Reflux     Past Surgical History:  Procedure Laterality Date  . ENDOMETRIAL ABLATION  05/2008  . HYSTEROSCOPY      Prior to Admission medications   Medication Sig Start Date End Date Taking? Authorizing Provider  atorvastatin (LIPITOR) 20 MG tablet Take 20 mg by mouth at bedtime.  10/04/17  Yes [provider]  carboxymethylcellulose (REFRESH PLUS) 0.5 % SOLN Place 1 drop into both eyes 3 (three) times daily as needed.   Yes [provider]  hydrochlorothiazide (HYDRODIURIL) 25 MG tablet Take 25 mg by mouth daily.   Yes [provider]  ibuprofen (ADVIL,MOTRIN) 200 MG tablet Take 400 mg by mouth every 8 (eight) hours as needed (for pain).   Yes [provider]  metFORMIN (GLUCOPHAGE) 500 MG tablet Take 500 mg by mouth 2 (two) times daily.   Yes [provider]  metoprolol succinate (TOPROL-XL) 50 MG 24 hr tablet Take 50 mg by mouth at bedtime. Take with or immediately following a meal.    Yes [provider]  pantoprazole (PROTONIX) 40 MG tablet Take 1 tablet (40 mg total) by mouth 2 (two) times daily before a meal. Patient taking differently: Take 40 mg by mouth daily before breakfast.  08/31/16  Yes Annitta Needs, NP  ranitidine (ZANTAC) 150 MG tablet Take 1 tablet (150 mg total) by mouth at bedtime. Patient not taking: Reported on 12/29/2017 11/24/17   Carlis Stable, NP    Allergies as of 11/24/2017  . (No Known Allergies)    Family History  Problem Relation Age of Onset  . Hypertension Mother   . Hypertension Father   . Hypertension Brother   . Heart disease  Maternal Grandmother   . Breast cancer Maternal Grandmother        BREAST CANCER-Age 42's  . Cancer Maternal Grandfather        LUNG CA  . Cancer Paternal Grandmother        BRAIN CA  . Colon cancer Neg Hx   . Colon polyps Neg Hx     Social History   Socioeconomic History  . Marital status: Married    Spouse name: Not on file  . Number of children: 2  . Years of education: Not on file  . Highest education level: Not on file  Occupational History  . Occupation: Freight forwarder at [employer]  . Financial resource strain: Not on file  . Food insecurity:    Worry: Not on file    Inability: Not on file  . Transportation needs:    Medical: Not on file    Non-medical: Not on file  Tobacco Use  . Smoking status: Never Smoker  . Smokeless tobacco: Never Used  Substance and Sexual Activity  . Alcohol use: No    Alcohol/week: 0.0 standard drinks  . Drug use: No  . Sexual activity: Yes    Birth control/protection: Other-see comments    Comment: Vasectomy, INTERCOURSE AGE 48, SEXUAL PARTNERS LESS THAN 5  Lifestyle  . Physical activity:    Days per week: Not on file  Minutes per session: Not on file  . Stress: Not on file  Relationships  . Social connections:    Talks on phone: Not on file    Gets together: Not on file    Attends religious service: Not on file    Active member of club or organization: Not on file    Attends meetings of clubs or organizations: Not on file    Relationship status: Not on file  . Intimate partner violence:    Fear of current or ex partner: Not on file    Emotionally abused: Not on file    Physically abused: Not on file    Forced sexual activity: Not on file  Other Topics Concern  . Not on file  Social History Narrative  . Not on file    Review of Systems: See HPI, otherwise negative ROS  Physical Exam: BP (!) 150/93   Pulse 74   Temp 98 F (36.7 C) (Oral)   Resp 16   Ht 5\' 9"  (1.753 m)   Wt 97.5 kg   LMP 12/22/2017    SpO2 100%   BMI 31.75 kg/m  General:   Alert,  Well-developed, well-nourished, pleasant and cooperative in NAD Neck:  Supple; no masses or thyromegaly. No significant cervical adenopathy. Lungs:  Clear throughout to auscultation.   No wheezes, crackles, or rhonchi. No acute distress. Heart:  Regular rate and rhythm; no murmurs, clicks, rubs,  or gallops. Abdomen: Non-distended, normal bowel sounds.  Soft and nontender without appreciable mass or hepatosplenomegaly.  Pulses:  Normal pulses noted. Extremities:  Without clubbing or edema.  Impression/Plan: 47 year old lady here for cough and GERD.  No prior EGD.  No dysphagia.  EGD now being done to further evaluate her symptoms.  The risks, benefits, limitations, alternatives and imponderables have been reviewed with the patient. Potential for esophageal dilation, biopsy, etc. have also been reviewed.  Questions have been answered. All parties agreeable.     Notice: This dictation was prepared with Dragon dictation along with smaller phrase technology. Any transcriptional errors that result from this process are unintentional and may not be corrected upon review.

## 2018-01-05 NOTE — Op Note (Signed)
[facility] Patient Name: Allison Patterson Procedure Date: 01/05/2018 10:22 AM MRN: [id number] Date of Birth: Apr 03, 1970 Attending MD: Norvel Richards , MD CSN: [id number] Age: 47 Admit Type: Outpatient Procedure:                Upper GI endoscopy Indications:              Heartburn; cough Providers:                Norvel Richards, MD, Gwynneth Albright RN,                            RN, Nelma Rothman, Technician Referring MD:              Medicines:                Midazolam 4 mg IV, Meperidine 50 mg IV, Ondansetron                            4 mg IV Complications:            No immediate complications. Estimated Blood Loss:     Estimated blood loss was minimal. Procedure:                Pre-Anesthesia Assessment:                           - Prior to the procedure, a History and Physical                            was performed, and patient medications and                            allergies were reviewed. The patient's tolerance of                            previous anesthesia was also reviewed. The risks                            and benefits of the procedure and the sedation                            options and risks were discussed with the patient.                            All questions were answered, and informed consent                            was obtained. Prior Anticoagulants: The patient has                            taken no previous anticoagulant or antiplatelet                            agents. ASA Grade Assessment: II - A patient with  mild systemic disease. After reviewing the risks                            and benefits, the patient was deemed in                            satisfactory condition to undergo the procedure.                           After obtaining informed consent, the endoscope was                            passed under direct vision. Throughout the                            procedure, the patient's  blood pressure, pulse, and                            oxygen saturations were monitored continuously. The                            GIF-H190 ([id number]) scope was introduced through the                            mouth, and advanced to the second part of duodenum.                            The upper GI endoscopy was accomplished without                            difficulty. The patient tolerated the procedure                            well. The upper GI endoscopy was accomplished                            without difficulty. The patient tolerated the                            procedure well. Scope In: 10:49:08 AM Scope Out: 10:54:48 AM Total Procedure Duration: 0 hours 5 minutes 40 seconds  Findings:      The examined esophagus was normal.      A small hiatal hernia was present.      Multiple 4 mm pedunculated and sessile polyps were found in the gastric       body. This was biopsied with a cold forceps for histology. Estimated       blood loss was minimal.      The exam was otherwise without abnormality.      The duodenal bulb and second portion of the duodenum were normal. Impression:               - Normal esophagus.                           - Small hiatal hernia.                           -  Multiple gastric polyps. Biopsied.                           - The examination was otherwise normal.                           - Normal duodenal bulb and second portion of the                            duodenum. Patient describes typical reflux                            symptoms?"frequent breakthrough on once daily PPI                            and nightly H2 blocker therapy. She also describes                            chronic cough. It is unclear if the two are related. Moderate Sedation:      Moderate (conscious) sedation was administered by the endoscopy nurse       and supervised by the endoscopist. The following parameters were       monitored: oxygen saturation, heart rate,  blood pressure, respiratory       rate, EKG, adequacy of pulmonary ventilation, and response to care.       Total physician intraservice time was 12 minutes. Recommendation:           - Patient has a contact number available for                            emergencies. The signs and symptoms of potential                            delayed complications were discussed with the                            patient. Return to normal activities tomorrow.                            Written discharge instructions were provided to the                            patient.                           - Resume previous diet.                           - Continue present medications. Stop Protonix and                            ranitidine; 76-month course of Nexium 40 mg twice                            daily. 15 pound weight loss in the next 3 months.  No late night meals before going to bed.                            Anti-reflux diet/lifestyle information provided. If                            cough not significantly improved within 3 months,                            recommend ENT evaluation. I will follow-up on                            pathology.                           - No repeat upper endoscopy.                           - Return to GI office in 3 months. Procedure Code(s):        --- Professional ---                           [phone], Esophagogastroduodenoscopy, flexible,                            transoral; with biopsy, single or multiple                           G0500, Moderate sedation services provided by the                            same physician or other qualified health care                            professional performing a gastrointestinal                            endoscopic service that sedation supports,                            requiring the presence of an independent trained                            observer to assist in the monitoring of the                             patient's level of consciousness and physiological                            status; initial 15 minutes of intra-service time;                            patient age 38 years or older (additional time 58  be reported with [phone], as appropriate) Diagnosis Code(s):        --- Professional ---                           K44.9, Diaphragmatic hernia without obstruction or                            gangrene                           K31.7, Polyp of stomach and duodenum                           R12, Heartburn CPT copyright 2018 American [employer] Association. All rights reserved. The codes documented in this report are preliminary and upon coder review may  be revised to meet current compliance requirements. Cristopher Estimable. Rourk, MD Norvel Richards, MD 01/05/2018 11:03:44 AM This report has been signed electronically. Number of Addenda: 0

## 2018-01-05 NOTE — Discharge Instructions (Signed)
EGD Discharge instructions Please read the instructions outlined below and refer to this sheet in the next few weeks. These discharge instructions provide you with general information on caring for yourself after you leave the hospital. Your doctor may also give you specific instructions. While your treatment has been planned according to the most current medical practices available, unavoidable complications occasionally occur. If you have any problems or questions after discharge, please call your doctor. ACTIVITY  You may resume your regular activity but move at a slower pace for the next 24 hours.   Take frequent rest periods for the next 24 hours.   Walking will help expel (get rid of) the air and reduce the bloated feeling in your abdomen.   No driving for 24 hours (because of the anesthesia (medicine) used during the test).   You may shower.   Do not sign any important legal documents or operate any machinery for 24 hours (because of the anesthesia used during the test).  NUTRITION  Drink plenty of fluids.   You may resume your normal diet.   Begin with a light meal and progress to your normal diet.   Avoid alcoholic beverages for 24 hours or as instructed by your caregiver.  MEDICATIONS  You may resume your normal medications unless your caregiver tells you otherwise.  WHAT YOU CAN EXPECT TODAY  You may experience abdominal discomfort such as a feeling of fullness or gas pains.  FOLLOW-UP  Your doctor will discuss the results of your test with you.  SEEK IMMEDIATE MEDICAL ATTENTION IF ANY OF THE FOLLOWING OCCUR:  Excessive nausea (feeling sick to your stomach) and/or vomiting.   Severe abdominal pain and distention (swelling).   Trouble swallowing.   Temperature over 101 F (37.8 C).   Rectal bleeding or vomiting of blood.    GERD information provided  Recommend weight loss of 15 pounds in the next 3 months  No late night meals or food intake before going  to bed (not within 3 hours)  Stop Protonix and ranitidine  Begin Nexium 40 mg before breakfast and before supper daily  Further recommendations to follow pending review of pathology report  Office visit with [place] in 3 months.  May need ear nose and throat evaluation of coughing.   Gastroesophageal Reflux Disease, Adult Normally, food travels down the esophagus and stays in the stomach to be digested. However, when a person has gastroesophageal reflux disease (GERD), food and stomach acid move back up into the esophagus. When this happens, the esophagus becomes sore and inflamed. Over time, GERD can create small holes (ulcers) in the lining of the esophagus. What are the causes? This condition is caused by a problem with the muscle between the esophagus and the stomach (lower esophageal sphincter, or LES). Normally, the LES muscle closes after food passes through the esophagus to the stomach. When the LES is weakened or abnormal, it does not close properly, and that allows food and stomach acid to go back up into the esophagus. The LES can be weakened by certain dietary substances, medicines, and medical conditions, including:  Tobacco use.  Pregnancy.  Having a hiatal hernia.  Heavy alcohol use.  Certain foods and beverages, such as coffee, chocolate, onions, and peppermint.  What increases the risk? This condition is more likely to develop in:  People who have an increased body weight.  People who have connective tissue disorders.  People who use NSAID medicines.  What are the signs or symptoms? Symptoms of this  condition include:  Heartburn.  Difficult or painful swallowing.  The feeling of having a lump in the throat.  Abitter taste in the mouth.  Bad breath.  Having a large amount of saliva.  Having an upset or bloated stomach.  Belching.  Chest pain.  Shortness of breath or wheezing.  Ongoing (chronic) cough or a night-time cough.  Wearing away of tooth  enamel.  Weight loss.  Different conditions can cause chest pain. Make sure to see your health care provider if you experience chest pain. How is this diagnosed? Your health care provider will take a medical history and perform a physical exam. To determine if you have mild or severe GERD, your health care provider may also monitor how you respond to treatment. You may also have other tests, including:  An endoscopy toexamine your stomach and esophagus with a small camera.  A test thatmeasures the acidity level in your esophagus.  A test thatmeasures how much pressure is on your esophagus.  A barium swallow or modified barium swallow to show the shape, size, and functioning of your esophagus.  How is this treated? The goal of treatment is to help relieve your symptoms and to prevent complications. Treatment for this condition may vary depending on how severe your symptoms are. Your health care provider may recommend:  Changes to your diet.  Medicine.  Surgery.  Follow these instructions at home: Diet  Follow a diet as recommended by your health care provider. This may involve avoiding foods and drinks such as: ? Coffee and tea (with or without caffeine). ? Drinks that containalcohol. ? Energy drinks and sports drinks. ? Carbonated drinks or sodas. ? Chocolate and cocoa. ? Peppermint and mint flavorings. ? Garlic and onions. ? Horseradish. ? Spicy and acidic foods, including peppers, chili powder, curry powder, vinegar, hot sauces, and barbecue sauce. ? Citrus fruit juices and citrus fruits, such as oranges, lemons, and limes. ? Tomato-based foods, such as red sauce, chili, salsa, and pizza with red sauce. ? Fried and fatty foods, such as donuts, french fries, potato chips, and high-fat dressings. ? High-fat meats, such as hot dogs and fatty cuts of red and white meats, such as rib eye steak, sausage, ham, and bacon. ? High-fat dairy items, such as whole milk, butter, and  cream cheese.  Eat small, frequent meals instead of large meals.  Avoid drinking large amounts of liquid with your meals.  Avoid eating meals during the 2-3 hours before bedtime.  Avoid lying down right after you eat.  Do not exercise right after you eat. General instructions  Pay attention to any changes in your symptoms.  Take over-the-counter and prescription medicines only as told by your health care provider. Do not take aspirin, ibuprofen, or other NSAIDs unless your health care provider told you to do so.  Do not use any tobacco products, including cigarettes, chewing tobacco, and e-cigarettes. If you need help quitting, ask your health care provider.  Wear loose-fitting clothing. Do not wear anything tight around your waist that causes pressure on your abdomen.  Raise (elevate) the head of your bed 6 inches (15cm).  Try to reduce your stress, such as with yoga or meditation. If you need help reducing stress, ask your health care provider.  If you are overweight, reduce your weight to an amount that is healthy for you. Ask your health care provider for guidance about a safe weight loss goal.  Keep all follow-up visits as told by your health care provider.  This is important. Contact a health care provider if:  You have new symptoms.  You have unexplained weight loss.  You have difficulty swallowing, or it hurts to swallow.  You have wheezing or a persistent cough.  Your symptoms do not improve with treatment.  You have a hoarse voice. Get help right away if:  You have pain in your arms, neck, jaw, teeth, or back.  You feel sweaty, dizzy, or light-headed.  You have chest pain or shortness of breath.  You vomit and your vomit looks like blood or coffee grounds.  You faint.  Your stool is bloody or black.  You cannot swallow, drink, or eat. This information is not intended to replace advice given to you by your health care provider. Make sure you discuss any  questions you have with your health care provider. Document Released: 11/19/2004 Document Revised: 07/10/2015 Document Reviewed: 06/06/2014 Elsevier Interactive Patient Education  [employer].

## 2018-01-06 ENCOUNTER — Encounter: Payer: Self-pay | Admitting: Internal Medicine

## 2018-01-06 LAB — GLUCOSE, CAPILLARY: Glucose-Capillary: 112 mg/dL — ABNORMAL HIGH (ref 70–99)

## 2018-01-11 ENCOUNTER — Encounter (HOSPITAL_COMMUNITY): Payer: Self-pay | Admitting: Internal Medicine

## 2018-01-26 ENCOUNTER — Ambulatory Visit (INDEPENDENT_AMBULATORY_CARE_PROVIDER_SITE_OTHER): Payer: 59 | Admitting: Women's Health

## 2018-01-26 ENCOUNTER — Encounter: Payer: Self-pay | Admitting: Women's Health

## 2018-01-26 VITALS — BP 128/80 | Ht 69.0 in | Wt 211.0 lb

## 2018-01-26 DIAGNOSIS — Z01419 Encounter for gynecological examination (general) (routine) without abnormal findings: Secondary | ICD-10-CM

## 2018-01-26 NOTE — Patient Instructions (Addendum)
Health Maintenance, Female Adopting a healthy lifestyle and getting preventive care can go a long way to promote health and wellness. Talk with your health care provider about what schedule of regular examinations is right for you. This is a good chance for you to check in with your provider about disease prevention and staying healthy. In between checkups, there are plenty of things you can do on your own. Experts have done a lot of research about which lifestyle changes and preventive measures are most likely to keep you healthy. Ask your health care provider for more information. Weight and diet Eat a healthy diet  Be sure to include plenty of vegetables, fruits, low-fat dairy products, and lean protein.  Do not eat a lot of foods high in solid fats, added sugars, or salt.  Get regular exercise. This is one of the most important things you can do for your health. ? Most adults should exercise for at least 150 minutes each week. The exercise should increase your heart rate and make you sweat (moderate-intensity exercise). ? Most adults should also do strengthening exercises at least twice a week. This is in addition to the moderate-intensity exercise.  Maintain a healthy weight  Body mass index (BMI) is a measurement that can be used to identify possible weight problems. It estimates body fat based on height and weight. Your health care provider can help determine your BMI and help you achieve or maintain a healthy weight.  For females 20 years of age and older: ? A BMI below 18.5 is considered underweight. ? A BMI of 18.5 to 24.9 is normal. ? A BMI of 25 to 29.9 is considered overweight. ? A BMI of 30 and above is considered obese.  Watch levels of cholesterol and blood lipids  You should start having your blood tested for lipids and cholesterol at 48 years of age, then have this test every 5 years.  You may need to have your cholesterol levels checked more often if: ? Your lipid or  cholesterol levels are high. ? You are older than 48 years of age. ? You are at high risk for heart disease.  Cancer screening Lung Cancer  Lung cancer screening is recommended for adults 55-80 years old who are at high risk for lung cancer because of a history of smoking.  A yearly low-dose CT scan of the lungs is recommended for people who: ? Currently smoke. ? Have quit within the past 15 years. ? Have at least a 30-pack-year history of smoking. A pack year is smoking an average of one pack of cigarettes a day for 1 year.  Yearly screening should continue until it has been 15 years since you quit.  Yearly screening should stop if you develop a health problem that would prevent you from having lung cancer treatment.  Breast Cancer  Practice breast self-awareness. This means understanding how your breasts normally appear and feel.  It also means doing regular breast self-exams. Let your health care provider know about any changes, no matter how small.  If you are in your 20s or 30s, you should have a clinical breast exam (CBE) by a health care provider every 1-3 years as part of a regular health exam.  If you are 40 or older, have a CBE every year. Also consider having a breast X-ray (mammogram) every year.  If you have a family history of breast cancer, talk to your health care provider about genetic screening.  If you are at high risk   for breast cancer, talk to your health care provider about having an MRI and a mammogram every year.  Breast cancer gene (BRCA) assessment is recommended for women who have family members with BRCA-related cancers. BRCA-related cancers include: ? Breast. ? Ovarian. ? Tubal. ? Peritoneal cancers.  Results of the assessment will determine the need for genetic counseling and BRCA1 and BRCA2 testing.  Cervical Cancer Your health care provider may recommend that you be screened regularly for cancer of the pelvic organs (ovaries, uterus, and  vagina). This screening involves a pelvic examination, including checking for microscopic changes to the surface of your cervix (Pap test). You may be encouraged to have this screening done every 3 years, beginning at age 22.  For women ages 56-65, health care providers may recommend pelvic exams and Pap testing every 3 years, or they may recommend the Pap and pelvic exam, combined with testing for human papilloma virus (HPV), every 5 years. Some types of HPV increase your risk of cervical cancer. Testing for HPV may also be done on women of any age with unclear Pap test results.  Other health care providers may not recommend any screening for nonpregnant women who are considered low risk for pelvic cancer and who do not have symptoms. Ask your health care provider if a screening pelvic exam is right for you.  If you have had past treatment for cervical cancer or a condition that could lead to cancer, you need Pap tests and screening for cancer for at least 20 years after your treatment. If Pap tests have been discontinued, your risk factors (such as having a new sexual partner) need to be reassessed to determine if screening should resume. Some women have medical problems that increase the chance of getting cervical cancer. In these cases, your health care provider may recommend more frequent screening and Pap tests.  Colorectal Cancer  This type of cancer can be detected and often prevented.  Routine colorectal cancer screening usually begins at 48 years of age and continues through 48 years of age.  Your health care provider may recommend screening at an earlier age if you have risk factors for colon cancer.  Your health care provider may also recommend using home test kits to check for hidden blood in the stool.  A small camera at the end of a tube can be used to examine your colon directly (sigmoidoscopy or colonoscopy). This is done to check for the earliest forms of colorectal  cancer.  Routine screening usually begins at age 33.  Direct examination of the colon should be repeated every 5-10 years through 48 years of age. However, you may need to be screened more often if early forms of precancerous polyps or small growths are found.  Skin Cancer  Check your skin from head to toe regularly.  Tell your health care provider about any new moles or changes in moles, especially if there is a change in a mole's shape or color.  Also tell your health care provider if you have a mole that is larger than the size of a pencil eraser.  Always use sunscreen. Apply sunscreen liberally and repeatedly throughout the day.  Protect yourself by wearing long sleeves, pants, a wide-brimmed hat, and sunglasses whenever you are outside.  Heart disease, diabetes, and high blood pressure  High blood pressure causes heart disease and increases the risk of stroke. High blood pressure is more likely to develop in: ? People who have blood pressure in the high end of  the normal range (130-139/85-89 mm Hg). ? People who are overweight or obese. ? People who are African American.  If you are 21-29 years of age, have your blood pressure checked every 3-5 years. If you are 3 years of age or older, have your blood pressure checked every year. You should have your blood pressure measured twice-once when you are at a hospital or clinic, and once when you are not at a hospital or clinic. Record the average of the two measurements. To check your blood pressure when you are not at a hospital or clinic, you can use: ? An automated blood pressure machine at a pharmacy. ? A home blood pressure monitor.  If you are between 17 years and 37 years old, ask your health care provider if you should take aspirin to prevent strokes.  Have regular diabetes screenings. This involves taking a blood sample to check your fasting blood sugar level. ? If you are at a normal weight and have a low risk for diabetes,  have this test once every three years after 48 years of age. ? If you are overweight and have a high risk for diabetes, consider being tested at a younger age or more often. Preventing infection Hepatitis B  If you have a higher risk for hepatitis B, you should be screened for this virus. You are considered at high risk for hepatitis B if: ? You were born in a country where hepatitis B is common. Ask your health care provider which countries are considered high risk. ? Your parents were born in a high-risk country, and you have not been immunized against hepatitis B (hepatitis B vaccine). ? You have HIV or AIDS. ? You use needles to inject street drugs. ? You live with someone who has hepatitis B. ? You have had sex with someone who has hepatitis B. ? You get hemodialysis treatment. ? You take certain medicines for conditions, including cancer, organ transplantation, and autoimmune conditions.  Hepatitis C  Blood testing is recommended for: ? Everyone born from [birth year] through 1966. ? Anyone with known risk factors for hepatitis C.  Sexually transmitted infections (STIs)  You should be screened for sexually transmitted infections (STIs) including gonorrhea and chlamydia if: ? You are sexually active and are younger than 48 years of age. ? You are older than 48 years of age and your health care provider tells you that you are at risk for this type of infection. ? Your sexual activity has changed since you were last screened and you are at an increased risk for chlamydia or gonorrhea. Ask your health care provider if you are at risk.  If you do not have HIV, but are at risk, it may be recommended that you take a prescription medicine daily to prevent HIV infection. This is called pre-exposure prophylaxis (PrEP). You are considered at risk if: ? You are sexually active and do not regularly use condoms or know the HIV status of your partner(s). ? You take drugs by injection. ? You are  sexually active with a partner who has HIV.  Talk with your health care provider about whether you are at high risk of being infected with HIV. If you choose to begin PrEP, you should first be tested for HIV. You should then be tested every 3 months for as long as you are taking PrEP. Pregnancy  If you are premenopausal and you may become pregnant, ask your health care provider about preconception counseling.  If you may become  pregnant, take 400 to 800 micrograms (mcg) of folic acid every day.  If you want to prevent pregnancy, talk to your health care provider about birth control (contraception). Osteoporosis and menopause  Osteoporosis is a disease in which the bones lose minerals and strength with aging. This can result in serious bone fractures. Your risk for osteoporosis can be identified using a bone density scan.  If you are 65 years of age or older, or if you are at risk for osteoporosis and fractures, ask your health care provider if you should be screened.  Ask your health care provider whether you should take a calcium or vitamin D supplement to lower your risk for osteoporosis.  Menopause may have certain physical symptoms and risks.  Hormone replacement therapy may reduce some of these symptoms and risks. Talk to your health care provider about whether hormone replacement therapy is right for you. Follow these instructions at home:  Schedule regular health, dental, and eye exams.  Stay current with your immunizations.  Do not use any tobacco products including cigarettes, chewing tobacco, or electronic cigarettes.  If you are pregnant, do not drink alcohol.  If you are breastfeeding, limit how much and how often you drink alcohol.  Limit alcohol intake to no more than 1 drink per day for nonpregnant women. One drink equals 12 ounces of beer, 5 ounces of wine, or 1 ounces of hard liquor.  Do not use street drugs.  Do not share needles.  Ask your health care  provider for help if you need support or information about quitting drugs.  Tell your health care provider if you often feel depressed.  Tell your health care provider if you have ever been abused or do not feel safe at home. This information is not intended to replace advice given to you by your health care provider. Make sure you discuss any questions you have with your health care provider. Document Released: 08/25/2010 Document Revised: 07/18/2015 Document Reviewed: 11/13/2014 Elsevier Interactive Patient Education  2018 [employer] Food Choices for Gastroesophageal Reflux Disease, Adult When you have gastroesophageal reflux disease (GERD), the foods you eat and your eating habits are very important. Choosing the right foods can help ease your discomfort. What guidelines do I need to follow?  Choose fruits, vegetables, whole grains, and low-fat dairy products.  Choose low-fat meat, fish, and poultry.  Limit fats such as oils, salad dressings, butter, nuts, and avocado.  Keep a food diary. This helps you identify foods that cause symptoms.  Avoid foods that cause symptoms. These may be different for everyone.  Eat small meals often instead of 3 large meals a day.  Eat your meals slowly, in a place where you are relaxed.  Limit fried foods.  Cook foods using methods other than frying.  Avoid drinking alcohol.  Avoid drinking large amounts of liquids with your meals.  Avoid bending over or lying down until 2-3 hours after eating. What foods are not recommended? These are some foods and drinks that may make your symptoms worse: Vegetables Tomatoes. Tomato juice. Tomato and spaghetti sauce. Chili peppers. Onion and garlic. Horseradish. Fruits Oranges, grapefruit, and lemon (fruit and juice). Meats High-fat meats, fish, and poultry. This includes hot dogs, ribs, ham, sausage, salami, and bacon. Dairy Whole milk and chocolate milk. Sour cream. Cream. Butter. Ice cream.  Cream cheese. Drinks Coffee and tea. Bubbly (carbonated) drinks or energy drinks. Condiments Hot sauce. Barbecue sauce. Sweets/Desserts Chocolate and cocoa. Donuts. Peppermint and spearmint. Fats and Oils   High-fat foods. This includes French fries and potato chips. Other Vinegar. Strong spices. This includes black pepper, white pepper, red pepper, cayenne, curry powder, cloves, ginger, and chili powder. The items listed above may not be a complete list of foods and drinks to avoid. Contact your dietitian for more information. This information is not intended to replace advice given to you by your health care provider. Make sure you discuss any questions you have with your health care provider. Document Released: 08/11/2011 Document Revised: 07/18/2015 Document Reviewed: 12/14/2012 Elsevier Interactive Patient Education  2017 Elsevier Inc.  

## 2018-01-26 NOTE — Progress Notes (Signed)
[place] Jun 05, 1970 [id number]    History: 47 year old MWF presents for annual exam. Cycles irregular, no cycle for the 1st 6 months of the year, then monthly cycles, vasectomy.  Endometrial ablation 2010.  Normal Pap and mammogram history.  Upper endoscopy/ polypectomy 2019. Diabetes, hypercholesteremia, acid reflux, chronic cough, now on Nexium which is helping.  Primary care manages labs and meds..   Past medical history, past surgical history, family history and social history were all reviewed and documented in the EPIC chart. 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 Husband is retired Engineer, structural, has two sons ages 73 and 27. Has a 25 month old grandchild. Parents hypertension.  ROS:  A ROS was performed and pertinent positives and negatives are included.  Exam:  Vitals:   01/26/18 0825  BP: 128/80  Weight: 211 lb (95.7 kg)  Height: 5\' 9"  (1.753 m)   Body mass index is 31.16 kg/m.   General appearance:  Normal Thyroid:  Symmetrical, normal in size, without palpable masses or nodularity. Respiratory  Auscultation:  Clear without wheezing or rhonchi Cardiovascular  Auscultation:  Regular rate, without rubs, murmurs or gallops  Edema/varicosities:  Not grossly evident Abdominal  Soft,nontender, without masses, guarding or rebound.  Liver/spleen:  No organomegaly noted  Hernia:  None appreciated  Skin  Inspection:  Grossly normal   Breasts: Examined lying and sitting.     Right: Without masses, retractions, discharge or axillary adenopathy.     Left: Without masses, retractions, discharge or axillary adenopathy. Gentitourinary   Inguinal/mons:  Normal without inguinal adenopathy  External genitalia:  Normal  BUS/Urethra/Skene's glands:  Normal  Vagina:  Normal  Cervix:  Normal  Uterus: normal in size, shape and contour.  Midline and mobile  Adnexa/parametria:     Rt: Without masses or tenderness.   Lt: Without masses or tenderness.  Anus and  perineum: Normal  Digital rectal exam: Normal sphincter tone without palpated masses or tenderness  Assessment  47 y.o.  MWF G2P2 for annual exam.     Perimenopausal/ irregular cycles/ 2010 endometrial ablation Husband Vasectomy Hypertension/Diabetes/Hypercholesterimia- Primary Care  manages medicine and labs. Obesity  Plan: SBE's, continue annual screening mammogram, schedule colonoscopy in two years.  Menopause reviewed.  Encouraged exercise and packing lunch to reduce calorie/carb intake. Calcium rich diet and vitamin D 10000 daily encouraged, Pap normal 2018, new screening guidelines reviewed.   Huel Cote [facility], 8:35 AM 01/26/2018

## 2018-02-10 ENCOUNTER — Ambulatory Visit: Payer: 59 | Admitting: Gastroenterology

## 2018-03-09 ENCOUNTER — Ambulatory Visit: Payer: 59 | Admitting: Nurse Practitioner

## 2018-07-23 ENCOUNTER — Other Ambulatory Visit: Payer: Self-pay | Admitting: Internal Medicine

## 2018-07-27 ENCOUNTER — Telehealth: Payer: Self-pay | Admitting: *Deleted

## 2018-07-27 ENCOUNTER — Telehealth: Payer: Self-pay | Admitting: Internal Medicine

## 2018-07-27 NOTE — Telephone Encounter (Signed)
Disregard this message. After I hung up with the patient the pharmacy faxed a refill request over and it's in the tray.

## 2018-07-27 NOTE — Telephone Encounter (Signed)
Noted  

## 2018-07-27 NOTE — Telephone Encounter (Signed)
Pt was following up on a prescription refill of generic nexium. She said that [facility] in [place] hasn't received anything from [place]. [phone]

## 2018-07-27 NOTE — Telephone Encounter (Signed)
Received refill request for nexium 40mg  take 1 cap by mouth BID

## 2018-08-02 MED ORDER — ESOMEPRAZOLE MAGNESIUM 40 MG PO CPDR: 40.0000 mg | DELAYED_RELEASE_CAPSULE | Freq: Two times a day (BID) | ORAL | 3 refills | Status: DC

## 2018-08-02 NOTE — Addendum Note (Signed)
Addended by: Annitta Needs on: 08/02/2018 03:55 PM   Modules accepted: Orders

## 2018-09-14 ENCOUNTER — Other Ambulatory Visit: Payer: Self-pay | Admitting: Women's Health

## 2018-09-14 DIAGNOSIS — Z1231 Encounter for screening mammogram for malignant neoplasm of breast: Secondary | ICD-10-CM

## 2018-10-28 ENCOUNTER — Ambulatory Visit: Payer: 59

## 2018-12-12 ENCOUNTER — Other Ambulatory Visit: Payer: Self-pay

## 2018-12-12 ENCOUNTER — Ambulatory Visit
Admission: RE | Admit: 2018-12-12 | Discharge: 2018-12-12 | Disposition: A | Discharge status: Home / Self Care | Payer: BC Managed Care – PPO | Source: Ambulatory Visit | Attending: Women's Health | Admitting: Women's Health

## 2018-12-12 DIAGNOSIS — Z1231 Encounter for screening mammogram for malignant neoplasm of breast: Secondary | ICD-10-CM

## 2019-01-31 ENCOUNTER — Encounter: Payer: 59 | Admitting: Women's Health

## 2020-02-19 ENCOUNTER — Telehealth: Payer: Self-pay | Admitting: Internal Medicine

## 2020-02-19 NOTE — Telephone Encounter (Signed)
Called to Discuss with patient about Covid symptoms and the use of the monoclonal antibody infusion for those with mild to moderate Covid symptoms and at a high risk of hospitalization.     Symptom onset: 02/15/20  Vaccinated: yes Qualified for Infusion: due to narrowing guidelines in setting of limited medication supply, pt does not qualify as she is fully vaccinated.  Alan Ripper, [place]

## 2020-11-18 ENCOUNTER — Other Ambulatory Visit: Payer: Self-pay | Admitting: Foot & Ankle Surgery

## 2020-11-18 ENCOUNTER — Other Ambulatory Visit: Payer: Self-pay | Admitting: Nurse Practitioner

## 2020-11-18 DIAGNOSIS — Z1231 Encounter for screening mammogram for malignant neoplasm of breast: Secondary | ICD-10-CM

## 2020-12-18 ENCOUNTER — Ambulatory Visit
Admission: RE | Admit: 2020-12-18 | Discharge: 2020-12-18 | Disposition: A | Discharge status: Home / Self Care | Payer: Commercial Managed Care - PPO | Source: Ambulatory Visit | Attending: Nurse Practitioner | Admitting: Nurse Practitioner

## 2020-12-18 DIAGNOSIS — Z1231 Encounter for screening mammogram for malignant neoplasm of breast: Secondary | ICD-10-CM

## 2020-12-18 IMAGING — MG MM DIGITAL SCREENING BILAT W/ TOMO AND CAD
8 series · 8 of 24 positions shown · non-contrast
Comparison: Previous exam(s).

CLINICAL DATA: Screening.

EXAM:
DIGITAL SCREENING BILATERAL MAMMOGRAM WITH TOMOSYNTHESIS AND CAD
TECHNIQUE: Bilateral screening digital craniocaudal and mediolateral oblique
mammograms were obtained. Bilateral screening digital breast
tomosynthesis was performed. The images were evaluated with
computer-aided detection.

[R MLO synth-2D]
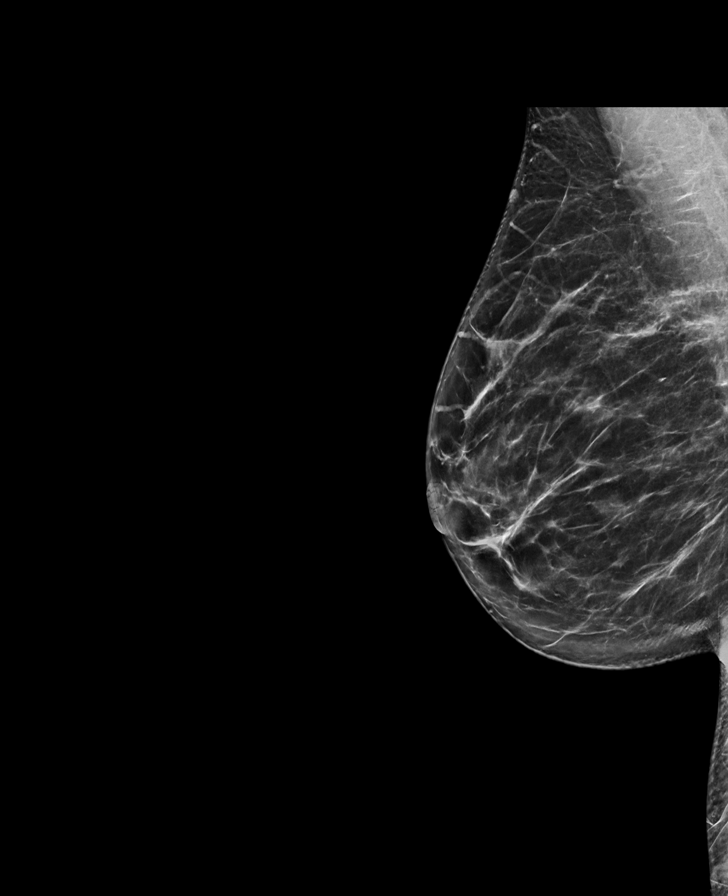

[L MLO synth-2D]
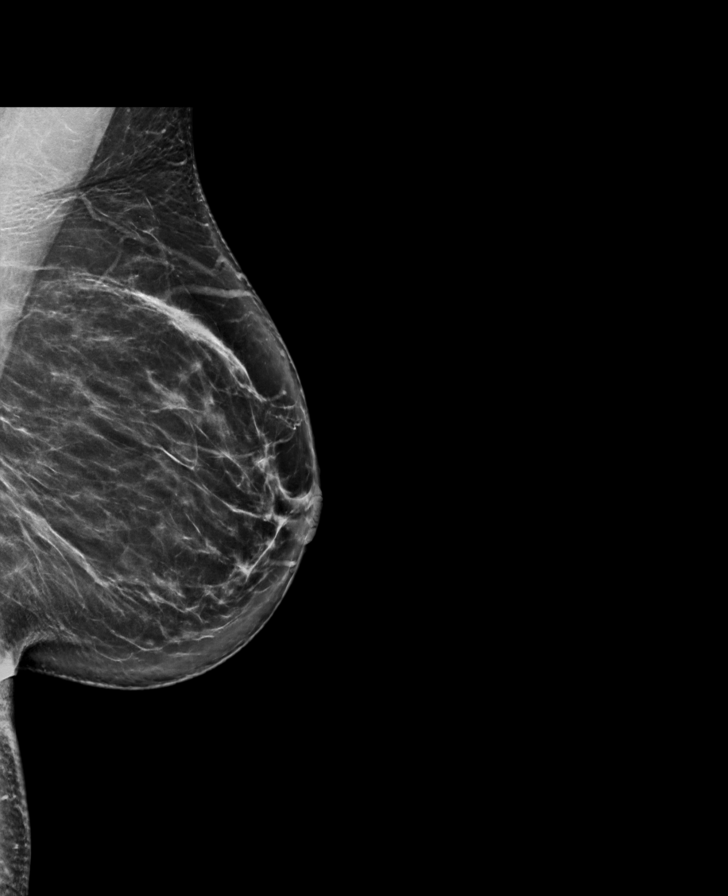

[L CC synth-2D]
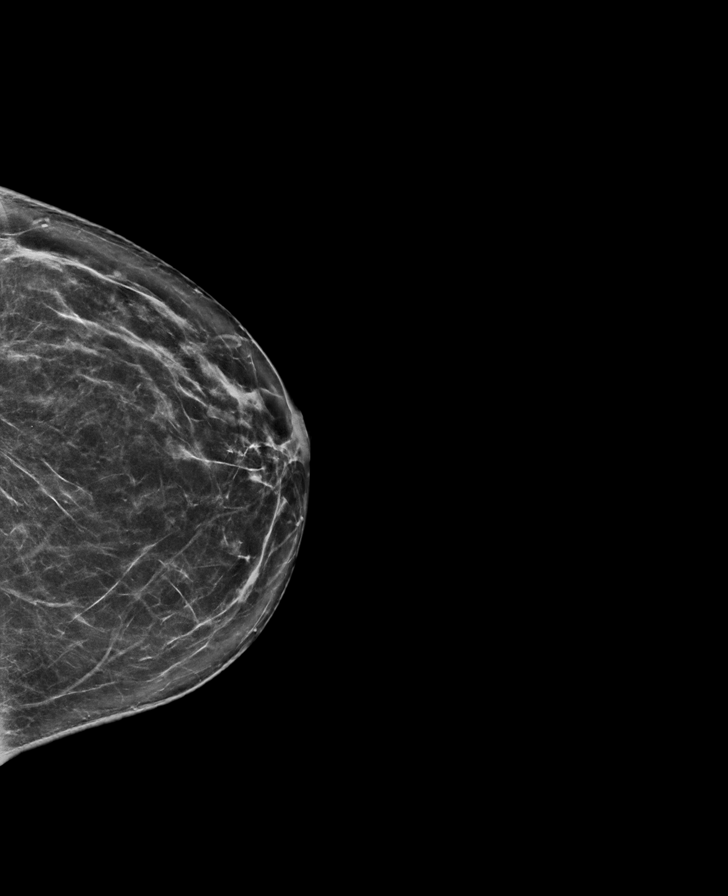

[R CC synth-2D]
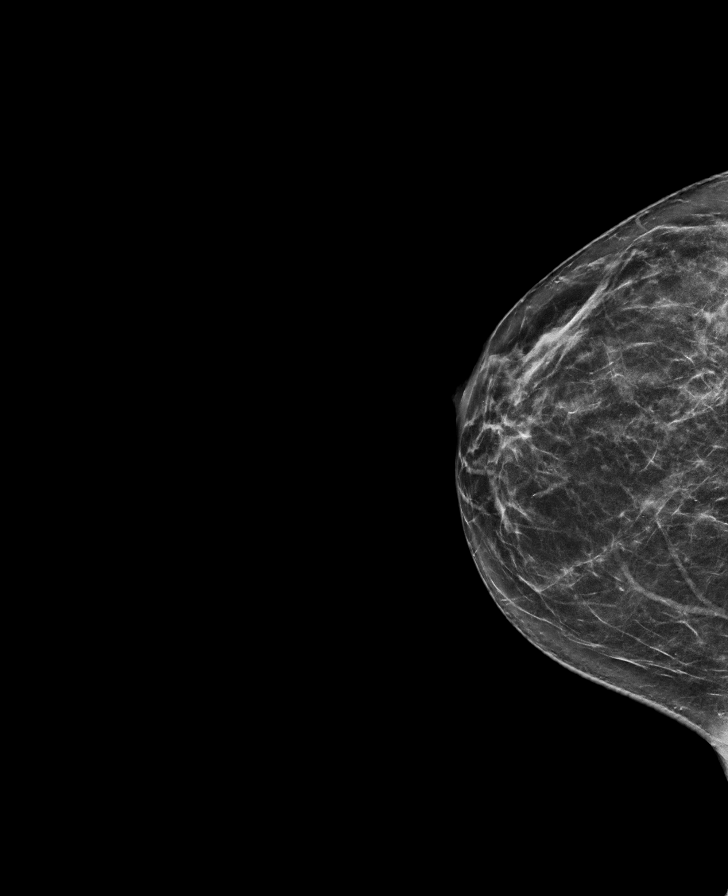

[L MLO tomo · tomo slice 41/82.0]
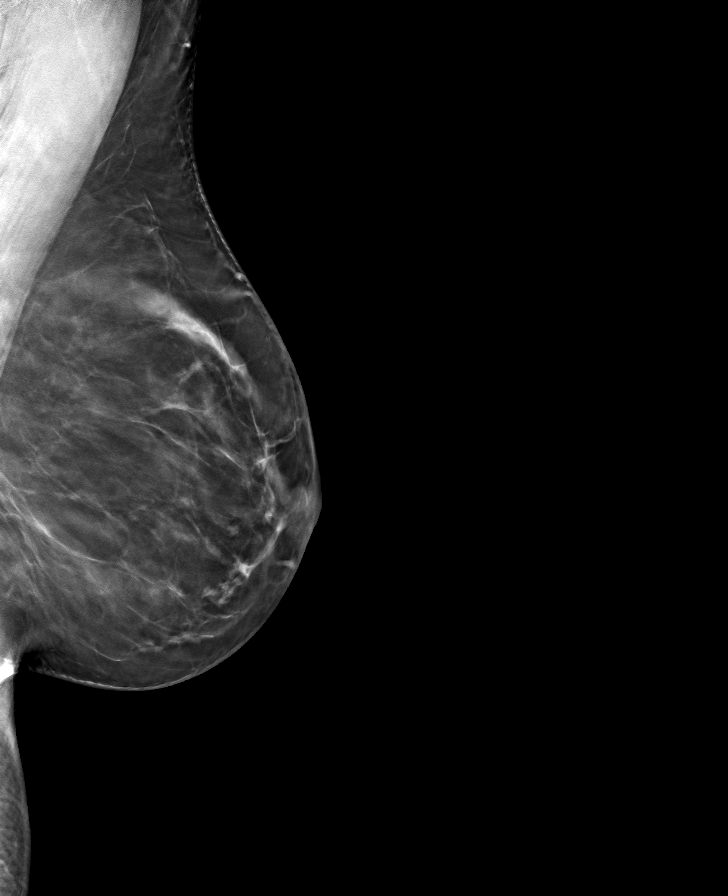

[L CC tomo · tomo slice 39/76.0]
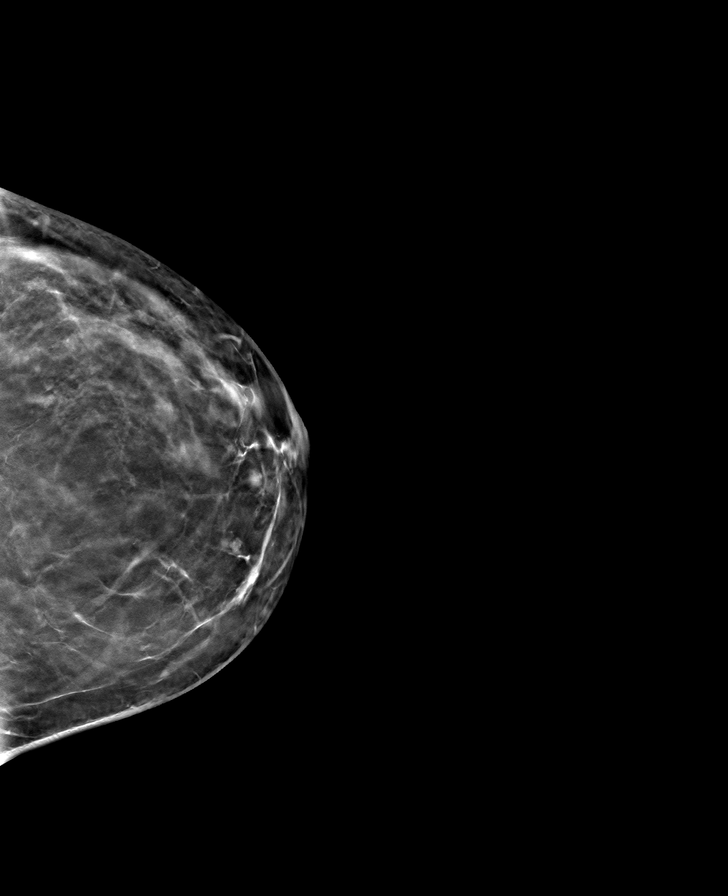

[R MLO tomo · tomo slice 39/78.0]
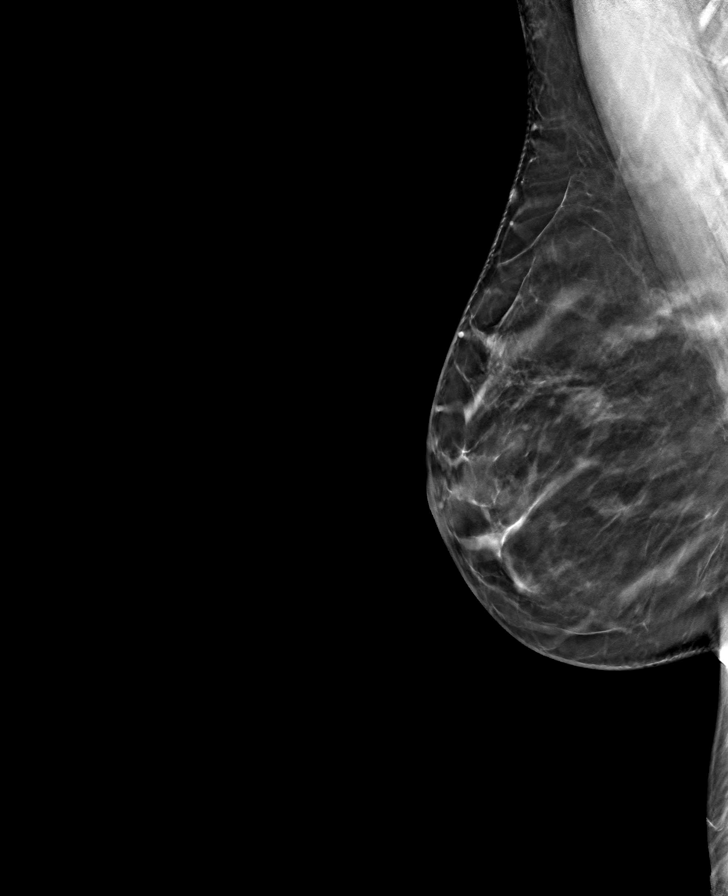

[R CC tomo · tomo slice 36/71.0]
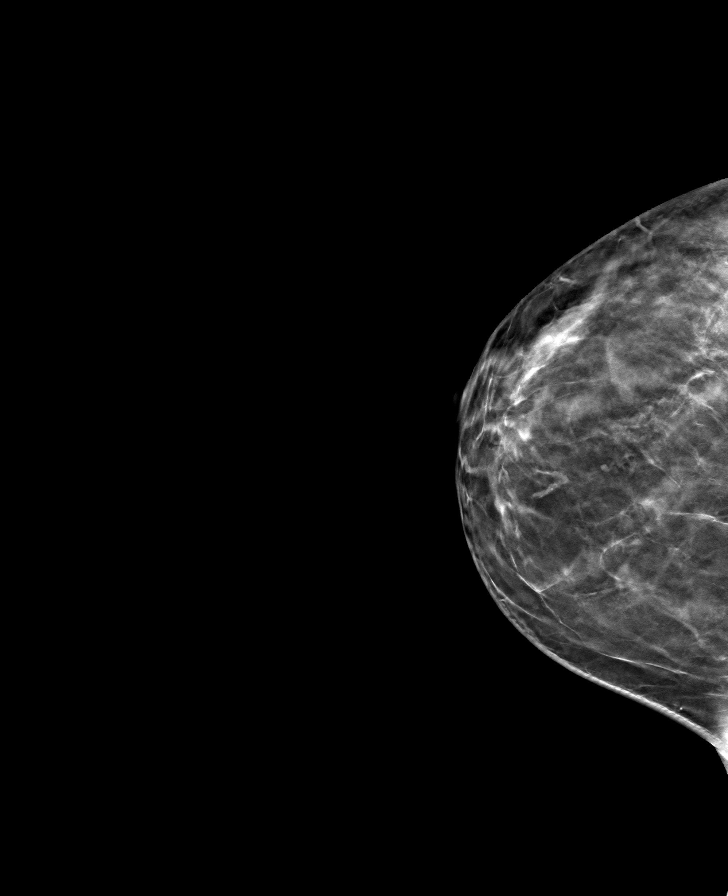

[8 of 24 positions shown; findings below may reference images not displayed]

ACR Breast Density Category b: There are scattered areas of
fibroglandular density.
FINDINGS: There are no findings suspicious for malignancy.
IMPRESSION: No mammographic evidence of malignancy. A result letter of this
screening mammogram will be mailed directly to the patient.

RECOMMENDATION:
Screening mammogram in one year. (Code:[BY])

BI-RADS CATEGORY  1: Negative.

## 2021-04-23 ENCOUNTER — Ambulatory Visit: Payer: 59 | Admitting: Cardiology

## 2021-04-23 ENCOUNTER — Other Ambulatory Visit: Payer: Self-pay

## 2021-04-23 ENCOUNTER — Encounter: Payer: Self-pay | Admitting: Cardiology

## 2021-04-23 VITALS — BP 120/80 | HR 81 | Temp 98.5°F | Ht 69.0 in | Wt 220.0 lb

## 2021-04-23 DIAGNOSIS — E119 Type 2 diabetes mellitus without complications: Secondary | ICD-10-CM

## 2021-04-23 DIAGNOSIS — R072 Precordial pain: Secondary | ICD-10-CM

## 2021-04-23 DIAGNOSIS — I10 Essential (primary) hypertension: Secondary | ICD-10-CM

## 2021-04-23 DIAGNOSIS — R0609 Other forms of dyspnea: Secondary | ICD-10-CM

## 2021-04-23 DIAGNOSIS — E7800 Pure hypercholesterolemia, unspecified: Secondary | ICD-10-CM

## 2021-04-23 NOTE — Progress Notes (Signed)
? ?Primary Physician/Referring:  Glenda Chroman, MD ? ?Patient ID: Allison Patterson, female    DOB: 1970/03/25, 51 y.o.   MRN: [id number] ? ?Chief Complaint  ?Patient presents with  ? Chest Pain  ? New Patient (Initial Visit)  ? ?HPI:   ? ?Allison Patterson  is a 51 y.o. Caucasian female patient with primary hypertension, hyperlipidemia, diabetes mellitus referred to me for evaluation of chest pain.  Patient has been having episodes of chest pain that started sometime about a month ago, episodes described as tightness in the middle of the chest and with or without exertion activity.  She has also noticed exertional dyspnea when she climbs 1 flight of stairs.  Accompanied by husband. ? ?Denies any recent long travel, no dizziness or syncope, no leg edema, no PND or orthopnea.  She has occasional palpitations. ? ?Past Medical History:  ?Diagnosis Date  ? Diabetes ([place]) JAN 2018  ? Elevated cholesterol   ? Hirsutism   ? Hypertension   ? Reflux   ? ?Past Surgical History:  ?Procedure Laterality Date  ? ENDOMETRIAL ABLATION  05/2008  ? ESOPHAGOGASTRODUODENOSCOPY N/A 01/05/2018  ? Procedure: ESOPHAGOGASTRODUODENOSCOPY (EGD);  Surgeon: Daneil Dolin, MD;  [facility];  Service: Endoscopy;  Laterality: N/A;  2:00pm  ? HYSTEROSCOPY    ? POLYPECTOMY  01/05/2018  ? Procedure: POLYPECTOMY;  Surgeon: Daneil Dolin, MD;  [facility];  Service: Endoscopy;;  ? ?Family History  ?Problem Relation Age of Onset  ? Hypertension Mother   ? Hypertension Father   ? Hypertension Brother   ? Heart attack Maternal Grandmother   ? Heart disease Maternal Grandmother   ? Breast cancer Maternal Grandmother   ?     BREAST CANCER-Age 36's  ? Cancer Maternal Grandfather   ?     LUNG CA  ? Cancer Paternal Grandmother   ?     BRAIN CA  ? Colon cancer Neg Hx   ? Colon polyps Neg Hx   ?  ?Social History  ? ?Tobacco Use  ? Smoking status: Never  ? Smokeless tobacco: Never  ?Substance Use Topics  ? Alcohol use: No  ?   Alcohol/week: 0.0 standard drinks  ? ?Marital Status: Married  ?ROS  ?Review of Systems  ?Cardiovascular:  Positive for chest pain and dyspnea on exertion. Negative for leg swelling.  ?Objective  ?Blood pressure (!) 130/93, pulse 81, temperature 98.5 ?F (36.9 ?C), temperature source Temporal, height 5\' 9"  (1.753 m), weight 220 lb (99.8 kg), SpO2 97 %. Body mass index is 32.49 kg/m?.  ?Vitals with BMI 04/23/2021 01/26/2018 01/05/2018  ?Height 5\' 9"  5\' 9"  -  ?Weight 220 lbs 211 lbs -  ?BMI 32.47 31.15 -  ?Systolic 967 893 810  ?Diastolic 93 80 86  ?Pulse 81 - 76  ?  ?Physical Exam ?Constitutional:   ?   Appearance: She is obese.  ?Neck:  ?   Vascular: No JVD.  ?Cardiovascular:  ?   Rate and Rhythm: Normal rate and regular rhythm.  ?   Pulses: Intact distal pulses.  ?   Heart sounds: Normal heart sounds. No murmur heard. ?  No gallop.  ?Pulmonary:  ?   Effort: Pulmonary effort is normal.  ?   Breath sounds: Normal breath sounds.  ?Abdominal:  ?   General: Bowel sounds are normal.  ?   Palpations: Abdomen is soft.  ?Musculoskeletal:  ?   Right lower leg: No edema.  ?  Left lower leg: No edema.  ?  ? ?Medications and allergies  ?No Known Allergies  ? ?Medication list after today's encounter  ? ?Current Outpatient Medications:  ?  allopurinol (ZYLOPRIM) 100 MG tablet, Take 100 mg by mouth at bedtime., Disp: , Rfl:  ?  atorvastatin (LIPITOR) 20 MG tablet, Take 20 mg by mouth at bedtime. , Disp: , Rfl:  ?  carboxymethylcellulose (REFRESH PLUS) 0.5 % SOLN, Place 1 drop into both eyes 3 (three) times daily as needed., Disp: , Rfl:  ?  glimepiride (AMARYL) 2 MG tablet, Take 2 mg by mouth daily with breakfast., Disp: , Rfl:  ?  hydrochlorothiazide (HYDRODIURIL) 25 MG tablet, Take 25 mg by mouth daily., Disp: , Rfl:  ?  ibuprofen (ADVIL,MOTRIN) 200 MG tablet, Take 400 mg by mouth every 8 (eight) hours as needed (for pain)., Disp: , Rfl:  ?  metFORMIN (GLUCOPHAGE) 500 MG tablet, Take 500 mg by mouth 2 (two) times daily., Disp: ,  Rfl:  ?  metoprolol succinate (TOPROL-XL) 50 MG 24 hr tablet, Take 50 mg by mouth at bedtime. Take with or immediately following a meal. , Disp: , Rfl:  ?  pantoprazole (PROTONIX) 40 MG tablet, Take 40 mg by mouth daily., Disp: , Rfl:  ? ?Laboratory examination:  ? ?External labs:  ? ?Cholesterol, total 156.000 m 03/27/2020 ?HDL 55.000 mg 03/27/2020 ?LDL 70.000 mg 03/27/2020 ?Triglycerides 190.000 m 03/27/2020 ? ?A1C 7.300 % 04/17/2021 ?TSH 1.680 03/27/2020 ? ?Hemoglobin 13.400 g/d 03/27/2020 ?Platelets 244.000 x1 03/27/2020 ? ?Creatinine, Serum 0.760 mg/ 03/27/2020 ?Potassium 4.000 mm 04/01/2021 ?ALT (SGPT) 31.000 IU/ 04/01/2021 ? ?Radiology:  ? ? ? ?Cardiac Studies:  ? ?NA ? ?EKG:  ? ?EKG 04/23/2021: Normal sinus rhythm at the rate of 66 bpm. ? ?PCP EKG 04/01/2021:Suspect lead reversal.  Interpretation assumes no reversal.  Normal sinus rhythm with rate of 72 bpm, extreme left axis deviation.  Cannot exclude inferior and lateral infarct old.  No evidence of ischemia, normal QT interval.  Low-voltage complexes. ? ?Assessment  ? ?  ICD-10-CM   ?1. Chest pain of uncertain etiology  E56.3 EKG 12-Lead  ?  ?  ? ?Medications Discontinued During This Encounter  ?Medication Reason  ? hydrochlorothiazide (HYDRODIURIL) 25 MG tablet Duplicate  ? esomeprazole (NEXIUM) 40 MG capsule Duplicate  ?  ?No orders of the defined types were placed in this encounter. ? ?Orders Placed This Encounter  ?Procedures  ? EKG 12-Lead  ? ?Recommendations:  ? ?Allison Patterson is a 51 y.o. Caucasian female patient with primary hypertension, hyperlipidemia, diabetes mellitus referred to me for evaluation of chest pain.  Patient has been having episodes of chest pain that started sometime about a month ago, episodes described as tightness in the middle of the chest and with or without exertion activity.  She has also noticed exertional dyspnea when she climbs 1 flight of stairs.  Accompanied by husband. ? ?Physical examination is normal except for mild obesity.  EKG  is normal.  Reviewed EKG from the PCPs office, suspect lead reversal. ? ?In view of her cardiovascular risks, I have recommended exercise nuclear stress test and also echocardiogram.  I will also obtain coronary calcium score for further cardiac risk stratification. ? ?Her blood pressure was slightly elevated upon presentation, he did come close to normalization but still not normal.  I would like to have her blood pressure closer to 75 mmHg diastolic in view of her age and diabetes.  I did not make any changes to her  medications. ? ?I would like to see her back in 4 to 6 weeks for follow-up.  Obesity discussed, weight loss discussed, calorie restriction discussed. ? ? ? ?Allison Prows, MD, [facility] ?04/23/2021, 1:51 PM ?Office: [phone] ?

## 2021-05-05 ENCOUNTER — Ambulatory Visit: Payer: 59

## 2021-05-05 ENCOUNTER — Other Ambulatory Visit: Payer: Self-pay

## 2021-05-05 DIAGNOSIS — R0609 Other forms of dyspnea: Secondary | ICD-10-CM

## 2021-05-16 ENCOUNTER — Ambulatory Visit
Admission: RE | Admit: 2021-05-16 | Discharge: 2021-05-16 | Disposition: A | Discharge status: Home / Self Care | Payer: Self-pay | Source: Ambulatory Visit | Attending: Cardiology | Admitting: Cardiology

## 2021-05-16 DIAGNOSIS — R0609 Other forms of dyspnea: Secondary | ICD-10-CM

## 2021-05-16 NOTE — Progress Notes (Signed)
Coronary calcium score 05/16/2021: ?Coronary calcium score of 0.  Ascending aorta upper limit of normal at 39 mm and ascending aorta normal at 26 mm in size. ?Small anterior mediastinal soft tissue nodule is indeterminate. This probably represents a small lymph node but consider a 6 month follow-up to ensure stability.

## 2021-05-19 ENCOUNTER — Ambulatory Visit: Payer: 59

## 2021-05-19 ENCOUNTER — Other Ambulatory Visit: Payer: Self-pay

## 2021-05-19 DIAGNOSIS — E119 Type 2 diabetes mellitus without complications: Secondary | ICD-10-CM

## 2021-05-19 DIAGNOSIS — R0609 Other forms of dyspnea: Secondary | ICD-10-CM

## 2021-05-19 DIAGNOSIS — R072 Precordial pain: Secondary | ICD-10-CM

## 2021-06-04 ENCOUNTER — Ambulatory Visit: Payer: 59 | Admitting: Cardiology

## 2021-06-04 ENCOUNTER — Encounter: Payer: Self-pay | Admitting: Cardiology

## 2021-06-04 VITALS — BP 114/75 | HR 71 | Temp 98.0°F | Resp 17 | Ht 69.0 in | Wt 216.8 lb

## 2021-06-04 DIAGNOSIS — R0609 Other forms of dyspnea: Secondary | ICD-10-CM

## 2021-06-04 DIAGNOSIS — E119 Type 2 diabetes mellitus without complications: Secondary | ICD-10-CM

## 2021-06-04 DIAGNOSIS — E7800 Pure hypercholesterolemia, unspecified: Secondary | ICD-10-CM

## 2021-06-04 DIAGNOSIS — I10 Essential (primary) hypertension: Secondary | ICD-10-CM

## 2021-06-04 NOTE — Progress Notes (Signed)
? ?Primary Physician/Referring:  Glenda Chroman, MD ? ?Patient ID: Allison Patterson, female    DOB: 08-29-1970, 51 y.o.   MRN: [id number] ? ?Chief Complaint  ?Patient presents with  ? Shortness of Breath  ? Chest Pain  ?  6 WEEKS  ? ?HPI:   ? ?Allison Patterson  is a 51 y.o. Caucasian female patient with primary hypertension, hyperlipidemia, diabetes mellitus referred to me for evaluation of chest pain, exertional dyspnea when she climbs 1 flight of stairs. ? ?I had seen her 6 weeks ago, she underwent cardiac testing and presents for follow-up, she has not had any further episodes of chest pain.  No change in her exertional dyspnea. ? ?Past Medical History:  ?Diagnosis Date  ? Diabetes ([place]) JAN 2018  ? Elevated cholesterol   ? Hirsutism   ? Hypertension   ? Reflux   ? ?Past Surgical History:  ?Procedure Laterality Date  ? ENDOMETRIAL ABLATION  05/2008  ? ESOPHAGOGASTRODUODENOSCOPY N/A 01/05/2018  ? Procedure: ESOPHAGOGASTRODUODENOSCOPY (EGD);  Surgeon: Daneil Dolin, MD;  [facility];  Service: Endoscopy;  Laterality: N/A;  2:00pm  ? HYSTEROSCOPY    ? POLYPECTOMY  01/05/2018  ? Procedure: POLYPECTOMY;  Surgeon: Daneil Dolin, MD;  [facility];  Service: Endoscopy;;  ? ?Family History  ?Problem Relation Age of Onset  ? Hypertension Mother   ? Hypertension Father   ? Hypertension Brother   ? Heart attack Maternal Grandmother   ? Heart disease Maternal Grandmother   ? Breast cancer Maternal Grandmother   ?     BREAST CANCER-Age 9's  ? Cancer Maternal Grandfather   ?     LUNG CA  ? Cancer Paternal Grandmother   ?     BRAIN CA  ? Colon cancer Neg Hx   ? Colon polyps Neg Hx   ?  ?Social History  ? ?Tobacco Use  ? Smoking status: Never  ? Smokeless tobacco: Never  ?Substance Use Topics  ? Alcohol use: No  ?  Alcohol/week: 0.0 standard drinks  ? ?Marital Status: Married  ?ROS  ?Review of Systems  ?Cardiovascular:  Positive for chest pain and dyspnea on exertion. Negative for leg  swelling.  ?Objective  ?Blood pressure 114/75, pulse 71, temperature 98 ?F (36.7 ?C), temperature source Temporal, resp. rate 17, height '5\' 9"'$  (1.753 m), weight 216 lb 12.8 oz (98.3 kg), SpO2 97 %. Body mass index is 32.02 kg/m?.  ? ?  06/04/2021  ? 10:16 AM 04/23/2021  ?  2:01 PM 04/23/2021  ?  1:16 PM  ?Vitals with BMI  ?Height '5\' 9"'$   '5\' 9"'$   ?Weight 216 lbs 13 oz  220 lbs  ?BMI 32  32.47  ?Systolic 938 182 993  ?Diastolic 75 80 93  ?Pulse 71  81  ?  ?Physical Exam ?Constitutional:   ?   Appearance: She is obese.  ?Neck:  ?   Vascular: No JVD.  ?Cardiovascular:  ?   Rate and Rhythm: Normal rate and regular rhythm.  ?   Pulses: Intact distal pulses.  ?   Heart sounds: Normal heart sounds. No murmur heard. ?  No gallop.  ?Pulmonary:  ?   Effort: Pulmonary effort is normal.  ?   Breath sounds: Normal breath sounds.  ?Abdominal:  ?   General: Bowel sounds are normal.  ?   Palpations: Abdomen is soft.  ?Musculoskeletal:  ?   Right lower leg: No edema.  ?   Left lower  leg: No edema.  ?  ? ?Medications and allergies  ?No Known Allergies  ? ?Medication list after today's encounter  ? ?Current Outpatient Medications:  ?  allopurinol (ZYLOPRIM) 100 MG tablet, Take 100 mg by mouth at bedtime., Disp: , Rfl:  ?  atorvastatin (LIPITOR) 20 MG tablet, Take 20 mg by mouth at bedtime. , Disp: , Rfl:  ?  carboxymethylcellulose (REFRESH PLUS) 0.5 % SOLN, Place 1 drop into both eyes 3 (three) times daily as needed., Disp: , Rfl:  ?  glimepiride (AMARYL) 2 MG tablet, Take 2 mg by mouth daily with breakfast., Disp: , Rfl:  ?  hydrochlorothiazide (HYDRODIURIL) 25 MG tablet, Take 25 mg by mouth daily., Disp: , Rfl:  ?  ibuprofen (ADVIL,MOTRIN) 200 MG tablet, Take 400 mg by mouth every 8 (eight) hours as needed (for pain)., Disp: , Rfl:  ?  metFORMIN (GLUCOPHAGE) 500 MG tablet, Take 500 mg by mouth 2 (two) times daily., Disp: , Rfl:  ?  metoprolol succinate (TOPROL-XL) 50 MG 24 hr tablet, Take 50 mg by mouth at bedtime. Take with or immediately  following a meal. , Disp: , Rfl:  ?  pantoprazole (PROTONIX) 40 MG tablet, Take 40 mg by mouth daily., Disp: , Rfl:  ?  rosuvastatin (CRESTOR) 10 MG tablet, Take 10 mg by mouth daily., Disp: , Rfl:  ?  TRESIBA FLEXTOUCH 100 UNIT/ML FlexTouch Pen, Inject 10 Units into the skin at bedtime. (Patient not taking: Reported on 06/04/2021), Disp: , Rfl:  ? ?Laboratory examination:  ? ?External labs:  ? ?Cholesterol, total 156.000 m 03/27/2020 ?HDL 55.000 mg 03/27/2020 ?LDL 70.000 mg 03/27/2020 ?Triglycerides 190.000 m 03/27/2020 ? ?A1C 7.300 % 04/17/2021 ?TSH 1.680 03/27/2020 ? ?Hemoglobin 13.400 g/d 03/27/2020 ?Platelets 244.000 x1 03/27/2020 ? ?Creatinine, Serum 0.760 mg/ 03/27/2020 ?Potassium 4.000 mm 04/01/2021 ?ALT (SGPT) 31.000 IU/ 04/01/2021 ? ?Radiology:  ? ?Coronary calcium score 05/16/2021: ?Coronary calcium score of 0.  Ascending aorta upper limit of normal at 39 mm and ascending aorta normal at 26 mm in size. ?Small anterior mediastinal soft tissue nodule is indeterminate. This probably represents a small lymph node but consider a 6 month follow-up to ensure stability. ? ?Cardiac Studies:  ? ?PCV MYOCARDIAL PERFUSION WO LEXISCAN 05/19/2021  ?Exercise nuclear stress test was performed using Bruce protocol. Patient reached 7 METS, and 86% of age predicted maximum heart rate. Exercise capacity was low. No chest pain reported. Heart rate and hemodynamic response were normal. Stress EKG revealed no ischemic changes. ?Normal myocardial perfusion. Stress LVEF 72%. ?Low risk study. ? ?PCV ECHOCARDIOGRAM COMPLETE 05/05/2021  ?Left ventricle cavity is normal in size and wall thickness. Normal global wall motion. Normal LV systolic function with EF 68%. Normal diastolic filling pattern. ?Mild (Grade I) mitral regurgitation. ?Mild tricuspid regurgitation. ?No evidence of pulmonary hypertension. ?   ? ?EKG:  ? ?EKG 04/23/2021: Normal sinus rhythm at the rate of 66 bpm. ? ?PCP EKG 04/01/2021:Suspect lead reversal.  Interpretation assumes no reversal.   Normal sinus rhythm with rate of 72 bpm, extreme left axis deviation.  Cannot exclude inferior and lateral infarct old.  No evidence of ischemia, normal QT interval.  Low-voltage complexes. ? ?Assessment  ? ?  ICD-10-CM   ?1. Dyspnea on exertion  R06.09   ?  ?2. Primary hypertension  I10   ?  ?3. Pure hypercholesterolemia  E78.00   ?  ?4. Type 2 diabetes mellitus without complication, without long-term current use of insulin (HCC)  E11.9   ?  ?  ? ?  There are no discontinued medications. ?  ?No orders of the defined types were placed in this encounter. ? ?No orders of the defined types were placed in this encounter. ? ?Recommendations:  ? ?Allison Patterson is a 51 y.o. Caucasian female patient with primary hypertension, hyperlipidemia, diabetes mellitus referred to me for evaluation of chest pain, exertional dyspnea when she climbs 1 flight of stairs. ? ?I had seen her 6 weeks ago, she underwent cardiac testing and presents for follow-up, she has not had any further episodes of chest pain.  I reviewed the coronary CT calcium score which is 0, excellent prognosis long-term.  Exercise nuclear stress test also discussed, markedly reduced exercise tolerance for age otherwise normal nuclear perfusion.  Again placing her at low risk.  Echocardiogram revealing no significant valvular abnormality and normal LV systolic function. ? ?Primary prevention extensively discussed with the patient.  I have recommended that she discontinue glipizide and consider going on GLP-1 agonist both for weight loss and diabetes control.  Presently although she has been prescribed [place] she does not use it. ? ?With regard to hyperlipidemia, triglycerides are elevated, labs are from past year, however with exercise and weight loss I suspect this will also improve. ? ?With regard to hypertension, blood pressure is well controlled.  She is on metoprolol for palpitations.  Suspect elevated heart rate is due to deconditioning.  Continue the  same for now, consider discontinuing HCTZ and switching her to an ACE inhibitor or an ARB. ? ?She has mild abnormality noted on the CT scan done for coronary calcium score, suspect she probably has a lymph

## 2021-11-17 ENCOUNTER — Encounter: Payer: Self-pay | Admitting: Obstetrics and Gynecology

## 2021-11-17 ENCOUNTER — Other Ambulatory Visit (HOSPITAL_COMMUNITY)
Admission: RE | Admit: 2021-11-17 | Discharge: 2021-11-17 | Disposition: A | Discharge status: Home / Self Care | Payer: BC Managed Care – PPO | Source: Ambulatory Visit | Attending: Obstetrics and Gynecology | Admitting: Obstetrics and Gynecology

## 2021-11-17 ENCOUNTER — Ambulatory Visit: Payer: BC Managed Care – PPO | Admitting: Obstetrics and Gynecology

## 2021-11-17 VITALS — BP 110/64 | HR 79 | Ht 68.0 in | Wt 193.0 lb

## 2021-11-17 DIAGNOSIS — Z124 Encounter for screening for malignant neoplasm of cervix: Secondary | ICD-10-CM | POA: Insufficient documentation

## 2021-11-17 DIAGNOSIS — R7989 Other specified abnormal findings of blood chemistry: Secondary | ICD-10-CM

## 2021-11-17 DIAGNOSIS — N939 Abnormal uterine and vaginal bleeding, unspecified: Secondary | ICD-10-CM

## 2021-11-17 DIAGNOSIS — Z01419 Encounter for gynecological examination (general) (routine) without abnormal findings: Secondary | ICD-10-CM

## 2021-11-17 HISTORY — DX: Other specified abnormal findings of blood chemistry: R79.89

## 2021-11-17 NOTE — Progress Notes (Signed)
51 y.o. G65P2002 Married Caucasian female here for annual exam.   Patient went from 2021 until 10-20-21 without a menses. Bleeding was dark brown and lasted 4 - 5 days.  Noticed with wiping.  She did have some right sided pain that preceded the bleeding.   Her last visit here was in 2019.   Hx endometrial ablation in the office in 2010. Periods following this were light.   Has a lump on her bottom.   No HRT.   Started Ozempic in June and lost 30 pounds.  She is continuing on this.  She does have diabetes.   PCP: Jerene Bears, MD    Patient's last menstrual period was 10/20/2021 (exact date).           Sexually active: Yes.    The current method of family planning is vasectomy.    Exercising: No.  The patient does not participate in regular exercise at present. Smoker:  no  Health Maintenance: Pap:   01-24-17 Neg:Neg HR HPV, 09-28-13 Neg:Neg HR HPV History of abnormal Pap:  no MMG:  12-11-20 Neg/Birads1 Colonoscopy:  none BMD:   n/a  Result  n/a TDaP: unsure Gardasil:   no HIV: no Hep C: no Screening Labs:  PCP   reports that she has never smoked. She has never used smokeless tobacco. She reports that she does not drink alcohol and does not use drugs.  Past Medical History:  Diagnosis Date   Diabetes ([place]) JAN 2018   Elevated cholesterol    Hirsutism    Hypertension    Reflux     Past Surgical History:  Procedure Laterality Date   ENDOMETRIAL ABLATION  05/2008   ESOPHAGOGASTRODUODENOSCOPY N/A 01/05/2018   Procedure: ESOPHAGOGASTRODUODENOSCOPY (EGD);  Surgeon: Daneil Dolin, MD;  [facility];  Service: Endoscopy;  Laterality: N/A;  2:00pm   HYSTEROSCOPY     POLYPECTOMY  01/05/2018   Procedure: POLYPECTOMY;  Surgeon: Daneil Dolin, MD;  [facility];  Service: Endoscopy;;    Current Outpatient Medications  Medication Sig Dispense Refill   carboxymethylcellulose (REFRESH PLUS) 0.5 % SOLN Place 1 drop into both eyes 3 (three) times daily  as needed.     hydrochlorothiazide (HYDRODIURIL) 25 MG tablet Take 25 mg by mouth daily.     ibuprofen (ADVIL,MOTRIN) 200 MG tablet Take 400 mg by mouth every 8 (eight) hours as needed (for pain).     metFORMIN (GLUCOPHAGE) 500 MG tablet Take 500 mg by mouth 2 (two) times daily.     metoprolol succinate (TOPROL-XL) 50 MG 24 hr tablet Take 50 mg by mouth at bedtime. Take with or immediately following a meal.      OZEMPIC, 0.25 OR 0.5 MG/DOSE, 2 MG/3ML SOPN Inject 0.5 mg into the skin once a week.     pantoprazole (PROTONIX) 40 MG tablet Take 40 mg by mouth daily.     rosuvastatin (CRESTOR) 10 MG tablet Take 10 mg by mouth daily.     TRESIBA FLEXTOUCH 100 UNIT/ML FlexTouch Pen Inject 10 Units into the skin at bedtime.     No current facility-administered medications for this visit.    Family History  Problem Relation Age of Onset   Hypertension Mother    Hypertension Father    Hypertension Brother    Heart attack Maternal Grandmother    Heart disease Maternal Grandmother    Breast cancer Maternal Grandmother        BREAST CANCER-Age 40's   Cancer Maternal Grandfather  LUNG CA   Cancer Paternal Grandmother        BRAIN CA   Colon cancer Neg Hx    Colon polyps Neg Hx     Review of Systems  All other systems reviewed and are negative.   Exam:   BP 110/64   Pulse 79   Ht '5\' 8"'$  (1.727 m)   Wt 193 lb (87.5 kg)   LMP 10/20/2021 (Exact Date)   SpO2 97%   BMI 29.35 kg/m     General appearance: alert, cooperative and appears stated age Head: normocephalic, without obvious abnormality, atraumatic Neck: no adenopathy, supple, symmetrical, trachea midline and thyroid normal to inspection and palpation Lungs: clear to auscultation bilaterally Breasts: normal appearance, no masses or tenderness, No nipple retraction or dimpling, No nipple discharge or bleeding, No axillary adenopathy Heart: regular rate and rhythm Abdomen: soft, non-tender; no masses, no  organomegaly Extremities: extremities normal, atraumatic, no cyanosis or edema Skin: skin color, texture, turgor normal. No rashes or lesions Lymph nodes: cervical, supraclavicular, and axillary nodes normal. Neurologic: grossly normal  Pelvic: External genitalia:  no lesions              No abnormal inguinal nodes palpated.              Urethra:  normal appearing urethra with no masses, tenderness or lesions              Bartholins and Skenes: normal                 Vagina: normal appearing vagina with normal color and discharge,  4 mm vaginal cyst at 6:00 and at 4:00 just inside the introitus.  Mucous clear and stringy.               Cervix: no lesions              Pap taken: yes Bimanual Exam:  Uterus:  normal size, contour, position, consistency, mobility, non-tender              Adnexa: no mass, fullness, tenderness              Rectal exam: yes.  Confirms.              Anus:  normal sphincter tone, no lesions  Chaperone was present for exam:  Estill Bamberg, CMA  Assessment:   Well woman visit with gynecologic exam. Vaginal bleeding.  Postmenopausal bleeding versus resumption of cycles post weight loss.  Endometrial ablation in office in 2010.  Recent weight loss on Ozempic.  Tiny vaginal cysts.   Plan: Mammogram screening discussed. Self breast awareness reviewed. Pap and HR HPV as above. Guidelines for Calcium, Vitamin D, regular exercise program including cardiovascular and weight bearing exercise. Will check FSH and estradiol level.  We discussed potential causes of post menopausal bleeding: atrophy, infection, polyp, precancer and cancer.  If postmenopausal, will have her return for pelvic US and possible EMB.  Procedures and rationale explained.  She understands this could be important for diagnosis of potential cancer if menopause is confirmed.   Follow up annually and prn.   After visit summary provided.

## 2021-11-17 NOTE — Patient Instructions (Signed)

## 2021-11-18 LAB — ESTRADIOL: Estradiol: 750 pg/mL — ABNORMAL HIGH

## 2021-11-18 LAB — FOLLICLE STIMULATING HORMONE: FSH: 2.2 m[IU]/mL

## 2021-11-19 ENCOUNTER — Encounter: Payer: Self-pay | Admitting: Obstetrics and Gynecology

## 2021-11-20 ENCOUNTER — Other Ambulatory Visit: Payer: Self-pay | Admitting: *Deleted

## 2021-11-20 DIAGNOSIS — N939 Abnormal uterine and vaginal bleeding, unspecified: Secondary | ICD-10-CM

## 2021-11-21 ENCOUNTER — Encounter: Payer: Self-pay | Admitting: Obstetrics and Gynecology

## 2021-11-21 LAB — CYTOLOGY - PAP
Comment: NEGATIVE
Diagnosis: UNDETERMINED — AB
High risk HPV: NEGATIVE

## 2021-11-26 ENCOUNTER — Telehealth: Payer: Self-pay

## 2021-11-26 NOTE — Telephone Encounter (Signed)
Dr. Quincy Simmonds sent message "Nunzio Cobbs, MD  Ramond Craver, RMA   Please make a note in the chart that I am to be informed if the patient cancels/reschedules/no shows for the appointment."  Patient is scheduled 01/01/22 at 3pm for u/s and visit with Dr. Quincy Simmonds.

## 2022-01-01 ENCOUNTER — Ambulatory Visit (INDEPENDENT_AMBULATORY_CARE_PROVIDER_SITE_OTHER): Payer: BC Managed Care – PPO | Admitting: Obstetrics and Gynecology

## 2022-01-01 ENCOUNTER — Ambulatory Visit: Payer: BC Managed Care – PPO

## 2022-01-01 ENCOUNTER — Encounter: Payer: Self-pay | Admitting: Obstetrics and Gynecology

## 2022-01-01 VITALS — BP 118/80 | Ht 68.0 in | Wt 193.0 lb

## 2022-01-01 DIAGNOSIS — R7989 Other specified abnormal findings of blood chemistry: Secondary | ICD-10-CM | POA: Diagnosis not present

## 2022-01-01 DIAGNOSIS — N939 Abnormal uterine and vaginal bleeding, unspecified: Secondary | ICD-10-CM

## 2022-01-01 DIAGNOSIS — N9489 Other specified conditions associated with female genital organs and menstrual cycle: Secondary | ICD-10-CM | POA: Diagnosis not present

## 2022-01-01 LAB — ESTRADIOL: Estradiol: 185 pg/mL

## 2022-01-01 NOTE — Progress Notes (Signed)
GYNECOLOGY  VISIT   HPI: 51 y.o.   Married  Caucasian  female   G2P2002 with No LMP recorded. (Menstrual status: Irregular Periods).   here to discuss ultrasound. Patient's husband is present today.  No menses from 2021 - 10/20/21.  Then had bleeding for 4 - 5 days.   Hx office cryo endometrial ablation in office 2010.   Elevated estradiol to 750 and FSH 2.2 on 11/17/21.   She has undergone 30 pound weight loss with Ozempic.   GYNECOLOGIC HISTORY: No LMP recorded. (Menstrual status: Irregular Periods). Contraception:  vasectomy Menopausal hormone therapy:  n/a Last mammogram:   12-11-20 Neg/Birads1  Last pap smear: 11/17/2021 atypical squamous cells,HPV neg,  01-24-17 Neg:Neg HR HPV, 09-28-13 Neg:Neg HR HPV  OB History     Gravida  2   Para  2   Term  2   Preterm      AB      Living  2      SAB      IAB      Ectopic      Multiple      Live Births                 Patient Active Problem List   Diagnosis Date Noted   Constipation 11/24/2017   Chronic cough 11/24/2017   GERD (gastroesophageal reflux disease) 08/31/2016   Diabetes mellitus type 2 in obese ([place]) 01/22/2016   Fibroids, intramural 10/25/2013   Hypercholesterolemia 01/12/2011    Past Medical History:  Diagnosis Date   Diabetes ([place]) 02/2016   Elevated cholesterol    High serum estradiol 11/17/2021   level 750, needs pelvic ultrasound   Hirsutism    Hypertension    Reflux     Past Surgical History:  Procedure Laterality Date   ENDOMETRIAL ABLATION  05/2008   ESOPHAGOGASTRODUODENOSCOPY N/A 01/05/2018   Procedure: ESOPHAGOGASTRODUODENOSCOPY (EGD);  Surgeon: Daneil Dolin, MD;  [facility];  Service: Endoscopy;  Laterality: N/A;  2:00pm   HYSTEROSCOPY     POLYPECTOMY  01/05/2018   Procedure: POLYPECTOMY;  Surgeon: Daneil Dolin, MD;  [facility]: [facility];  Service: Endoscopy;;    Current Outpatient Medications  Medication Sig Dispense Refill    carboxymethylcellulose (REFRESH PLUS) 0.5 % SOLN Place 1 drop into both eyes 3 (three) times daily as needed.     hydrochlorothiazide (HYDRODIURIL) 25 MG tablet Take 25 mg by mouth daily.     ibuprofen (ADVIL,MOTRIN) 200 MG tablet Take 400 mg by mouth every 8 (eight) hours as needed (for pain).     metFORMIN (GLUCOPHAGE) 500 MG tablet Take 500 mg by mouth 2 (two) times daily.     metoprolol succinate (TOPROL-XL) 50 MG 24 hr tablet Take 50 mg by mouth at bedtime. Take with or immediately following a meal.      OZEMPIC, 0.25 OR 0.5 MG/DOSE, 2 MG/3ML SOPN Inject 0.5 mg into the skin once a week.     pantoprazole (PROTONIX) 40 MG tablet Take 40 mg by mouth daily.     rosuvastatin (CRESTOR) 10 MG tablet Take 10 mg by mouth daily.     TRESIBA FLEXTOUCH 100 UNIT/ML FlexTouch Pen Inject 10 Units into the skin at bedtime.     No current facility-administered medications for this visit.     ALLERGIES: Patient has no known allergies.  Family History  Problem Relation Age of Onset   Hypertension Mother    Hypertension Father    Hypertension Brother  Heart attack Maternal Grandmother    Heart disease Maternal Grandmother    Breast cancer Maternal Grandmother        BREAST CANCER-Age 85's   Cancer Maternal Grandfather        LUNG CA   Cancer Paternal Grandmother        BRAIN CA   Colon cancer Neg Hx    Colon polyps Neg Hx     Social History   Socioeconomic History   Marital status: Married    Spouse name: Not on file   Number of children: 2   Years of education: Not on file   Highest education level: Not on file  Occupational History   Occupation: Freight forwarder at [employer]  Tobacco Use   Smoking status: Never   Smokeless tobacco: Never  Vaping Use   Vaping Use: Never used  Substance and Sexual Activity   Alcohol use: No    Alcohol/week: 0.0 standard drinks of alcohol   Drug use: No   Sexual activity: Yes    Birth control/protection: Other-see comments    Comment: Vasectomy,  INTERCOURSE AGE 84, SEXUAL PARTNERS LESS THAN 5  Other Topics Concern   Not on file  Social History Narrative   Not on file   Social Determinants of Health   Financial Resource Strain: Not on file  Food Insecurity: Not on file  Transportation Needs: Not on file  Physical Activity: Not on file  Stress: Not on file  Social Connections: Not on file  Intimate Partner Violence: Not on file    Review of Systems  All other systems reviewed and are negative.   PHYSICAL EXAMINATION:    BP 118/80 (BP Location: Right Arm, Patient Position: Sitting, Cuff Size: Normal)   Ht '5\' 8"'$  (1.727 m)   Wt 193 lb (87.5 kg)   BMI 29.35 kg/m     General appearance: alert, cooperative and appears stated age   Pelvic US Uterus 9.43 x 6.23 x 5.05 cm.  1.71 cm fibroid.  Inhomogeneous myometrium.   EMS 10.23 mm. Cystic changes with thickening and feeder vessel, 10 mm and possible 2.1 cm intracavitary mass.  Left ovary 4.33 x 3.44 x 3.05 cm.  1.5 cm. CL cyst. Right ovary 3.07 x 1.92 x 1.78 cm.  1.8 cm resolving right CL cyst and a 3.1 x 2.4 cm cystic structure.  No free fluid.   ASSESSMENT  Vaginal bleeding.   Possible endometrial mass. Endometrial ablation in office in 2010.  Cryoablation.  Elevated estradiol level.   PLAN  Pelvic US images and report reviewed.  Sonohysterogram versus hysteroscopy.  Will do sonohysterogram and possible endometrial biopsy.  Estradiol level recheck today.   An After Visit Summary was printed and given to the patient.  29 min  total time was spent for this patient encounter, including preparation, face-to-face counseling with the patient, coordination of care, and documentation of the encounter.

## 2022-01-27 NOTE — Progress Notes (Deleted)
GYNECOLOGY  VISIT   HPI: 51 y.o.   Married  Caucasian  female   G2P2002 with No LMP recorded. (Menstrual status: Irregular Periods).   here for   shgm w/ possible endo bx  GYNECOLOGIC HISTORY: No LMP recorded. (Menstrual status: Irregular Periods). Contraception:  vasectomy Menopausal hormone therapy:  n/a Last mammogram:  12-11-20, Breast Density Category B, Neg/Birads1   Last pap smear:   11/17/2021 atypical squamous cells,HPV neg,  01-24-17 Neg:Neg HR HPV, 09-28-13 Neg:Neg HR HPV          OB History     Gravida  2   Para  2   Term  2   Preterm      AB      Living  2      SAB      IAB      Ectopic      Multiple      Live Births                 Patient Active Problem List   Diagnosis Date Noted   Constipation 11/24/2017   Chronic cough 11/24/2017   GERD (gastroesophageal reflux disease) 08/31/2016   Diabetes mellitus type 2 in obese ([place]) 01/22/2016   Fibroids, intramural 10/25/2013   Hypercholesterolemia 01/12/2011    Past Medical History:  Diagnosis Date   Diabetes ([place]) 02/2016   Elevated cholesterol    High serum estradiol 11/17/2021   level 750, needs pelvic ultrasound   Hirsutism    Hypertension    Reflux     Past Surgical History:  Procedure Laterality Date   ENDOMETRIAL ABLATION  05/2008   ESOPHAGOGASTRODUODENOSCOPY N/A 01/05/2018   Procedure: ESOPHAGOGASTRODUODENOSCOPY (EGD);  Surgeon: Daneil Dolin, MD;  [facility];  Service: Endoscopy;  Laterality: N/A;  2:00pm   HYSTEROSCOPY     POLYPECTOMY  01/05/2018   Procedure: POLYPECTOMY;  Surgeon: Daneil Dolin, MD;  [facility];  Service: Endoscopy;;    Current Outpatient Medications  Medication Sig Dispense Refill   carboxymethylcellulose (REFRESH PLUS) 0.5 % SOLN Place 1 drop into both eyes 3 (three) times daily as needed.     hydrochlorothiazide (HYDRODIURIL) 25 MG tablet Take 25 mg by mouth daily.     ibuprofen (ADVIL,MOTRIN) 200 MG tablet Take 400 mg by  mouth every 8 (eight) hours as needed (for pain).     metFORMIN (GLUCOPHAGE) 500 MG tablet Take 500 mg by mouth 2 (two) times daily.     metoprolol succinate (TOPROL-XL) 50 MG 24 hr tablet Take 50 mg by mouth at bedtime. Take with or immediately following a meal.      OZEMPIC, 0.25 OR 0.5 MG/DOSE, 2 MG/3ML SOPN Inject 0.5 mg into the skin once a week.     pantoprazole (PROTONIX) 40 MG tablet Take 40 mg by mouth daily.     rosuvastatin (CRESTOR) 10 MG tablet Take 10 mg by mouth daily.     TRESIBA FLEXTOUCH 100 UNIT/ML FlexTouch Pen Inject 10 Units into the skin at bedtime.     No current facility-administered medications for this visit.     ALLERGIES: Patient has no known allergies.  Family History  Problem Relation Age of Onset   Hypertension Mother    Hypertension Father    Hypertension Brother    Heart attack Maternal Grandmother    Heart disease Maternal Grandmother    Breast cancer Maternal Grandmother        BREAST CANCER-Age 59's   Cancer Maternal  Grandfather        LUNG CA   Cancer Paternal Grandmother        BRAIN CA   Colon cancer Neg Hx    Colon polyps Neg Hx     Social History   Socioeconomic History   Marital status: Married    Spouse name: Not on file   Number of children: 2   Years of education: Not on file   Highest education level: Not on file  Occupational History   Occupation: Freight forwarder at [employer]  Tobacco Use   Smoking status: Never   Smokeless tobacco: Never  Vaping Use   Vaping Use: Never used  Substance and Sexual Activity   Alcohol use: No    Alcohol/week: 0.0 standard drinks of alcohol   Drug use: No   Sexual activity: Yes    Birth control/protection: Other-see comments    Comment: Vasectomy, INTERCOURSE AGE 51, SEXUAL PARTNERS LESS THAN 5  Other Topics Concern   Not on file  Social History Narrative   Not on file   Social Determinants of Health   Financial Resource Strain: Not on file  Food Insecurity: Not on file  Transportation  Needs: Not on file  Physical Activity: Not on file  Stress: Not on file  Social Connections: Not on file  Intimate Partner Violence: Not on file    Review of Systems  PHYSICAL EXAMINATION:    There were no vitals taken for this visit.    General appearance: alert, cooperative and appears stated age Head: Normocephalic, without obvious abnormality, atraumatic Neck: no adenopathy, supple, symmetrical, trachea midline and thyroid normal to inspection and palpation Lungs: clear to auscultation bilaterally Breasts: normal appearance, no masses or tenderness, No nipple retraction or dimpling, No nipple discharge or bleeding, No axillary or supraclavicular adenopathy Heart: regular rate and rhythm Abdomen: soft, non-tender, no masses,  no organomegaly Extremities: extremities normal, atraumatic, no cyanosis or edema Skin: Skin color, texture, turgor normal. No rashes or lesions Lymph nodes: Cervical, supraclavicular, and axillary nodes normal. No abnormal inguinal nodes palpated Neurologic: Grossly normal  Pelvic: External genitalia:  no lesions              Urethra:  normal appearing urethra with no masses, tenderness or lesions              Bartholins and Skenes: normal                 Vagina: normal appearing vagina with normal color and discharge, no lesions              Cervix: no lesions                Bimanual Exam:  Uterus:  normal size, contour, position, consistency, mobility, non-tender              Adnexa: no mass, fullness, tenderness              Rectal exam: {yes no:314532}.  Confirms.              Anus:  normal sphincter tone, no lesions  Chaperone was present for exam:  ***  ASSESSMENT     PLAN     An After Visit Summary was printed and given to the patient.  ______ minutes face to face time of which over 50% was spent in counseling.

## 2022-02-05 ENCOUNTER — Other Ambulatory Visit: Payer: BC Managed Care – PPO | Admitting: Obstetrics and Gynecology

## 2022-02-05 ENCOUNTER — Other Ambulatory Visit: Payer: BC Managed Care – PPO

## 2022-02-26 ENCOUNTER — Telehealth: Payer: Self-pay | Admitting: Obstetrics and Gynecology

## 2022-02-26 NOTE — Telephone Encounter (Signed)
Please contact patient in follow up to her pap and her upcoming sonohysterogram/endometrial biopsy appointment.   I am not certain if we formally discussed her pap when she came in for her ultrasound appointment.  Her pap showed atypical squamous cells of undetermined significance, and her high risk HPV test result was negative.  This is not considered a high risk pap or cancer.  Bon further evaluation of this is needed at this time. By current ASCCP guidelines, her next pap and high risk HPV testing are due in 3 years.   Please enter 36 month recall for September, 2028.   Have her keep her sonohysterogram/endometrial biopsy appointment so we can investigate better the endometrial changes and possible mass seen on her pelvic ultrasound on 01/01/22.

## 2022-02-26 NOTE — Telephone Encounter (Signed)
FYI. Pt notified and voiced understanding. Recall placed and pt's sono is scheduled for 03/26/2022.

## 2022-03-13 NOTE — Progress Notes (Signed)
GYNECOLOGY  VISIT   HPI: 52 y.o.   Married  Caucasian  female   G2P2002 with Patient's last menstrual period was 02/05/2022.   here for   sonohysterogram with possible endometrial biopsy.  Hx prior endometrial ablation in office in 2010.   No menses from 2021 until 10/20/21.   [place] and estradiol 11/17/21 confirmed she was not menopausal.   Prior to this the patient experienced intentional weight loss of 30 pounds with Ozempic.   Pelvic US on 01/01/22: Uterus 9.43 x 6.23 x 5.05 cm.  1.71 cm fibroid.  Inhomogeneous myometrium.   EMS 10.23 mm. Cystic changes with thickening and feeder vessel, 10 mm and possible 2.1 cm intracavitary mass.  Left ovary 4.33 x 3.44 x 3.05 cm.  1.5 cm. CL cyst. Right ovary 3.07 x 1.92 x 1.78 cm.  1.8 cm resolving right CL cyst and a 3.1 x 2.4 cm cystic structure.  No free fluid.   GYNECOLOGIC HISTORY: Patient's last menstrual period was 02/05/2022. Contraception:  vasectomy Menopausal hormone therapy:  n/a Last mammogram:  12/18/20 Breast Density Category B, BI-RADS CATEGORY 1 Neg Last pap smear:   11/17/21 ASCUS: HR HPV neg, 09/28/13 neg        OB History     Gravida  2   Para  2   Term  2   Preterm      AB      Living  2      SAB      IAB      Ectopic      Multiple      Live Births                 Patient Active Problem List   Diagnosis Date Noted   Constipation 11/24/2017   Chronic cough 11/24/2017   GERD (gastroesophageal reflux disease) 08/31/2016   Diabetes mellitus type 2 in obese ([place]) 01/22/2016   Fibroids, intramural 10/25/2013   Hypercholesterolemia 01/12/2011    Past Medical History:  Diagnosis Date   Diabetes ([place]) 02/2016   Elevated cholesterol    High serum estradiol 11/17/2021   level 750, needs pelvic ultrasound   Hirsutism    Hypertension    Reflux     Past Surgical History:  Procedure Laterality Date   ENDOMETRIAL ABLATION  05/2008   ESOPHAGOGASTRODUODENOSCOPY N/A 01/05/2018   Procedure:  ESOPHAGOGASTRODUODENOSCOPY (EGD);  Surgeon: Daneil Dolin, MD;  [facility];  Service: Endoscopy;  Laterality: N/A;  2:00pm   HYSTEROSCOPY     POLYPECTOMY  01/05/2018   Procedure: POLYPECTOMY;  Surgeon: Daneil Dolin, MD;  [facility];  Service: Endoscopy;;    Current Outpatient Medications  Medication Sig Dispense Refill   carboxymethylcellulose (REFRESH PLUS) 0.5 % SOLN Place 1 drop into both eyes 3 (three) times daily as needed.     hydrochlorothiazide (HYDRODIURIL) 25 MG tablet Take 25 mg by mouth daily.     ibuprofen (ADVIL,MOTRIN) 200 MG tablet Take 400 mg by mouth every 8 (eight) hours as needed (for pain).     metFORMIN (GLUCOPHAGE) 500 MG tablet Take 500 mg by mouth 2 (two) times daily.     metoprolol succinate (TOPROL-XL) 50 MG 24 hr tablet Take 50 mg by mouth at bedtime. Take with or immediately following a meal.      OZEMPIC, 0.25 OR 0.5 MG/DOSE, 2 MG/3ML SOPN Inject 0.5 mg into the skin once a week.     pantoprazole (PROTONIX) 40 MG tablet Take 40  mg by mouth daily.     rosuvastatin (CRESTOR) 10 MG tablet Take 10 mg by mouth daily.     TRESIBA FLEXTOUCH 100 UNIT/ML FlexTouch Pen Inject 10 Units into the skin at bedtime.     No current facility-administered medications for this visit.     ALLERGIES: Patient has no known allergies.  Family History  Problem Relation Age of Onset   Hypertension Mother    Hypertension Father    Hypertension Brother    Heart attack Maternal Grandmother    Heart disease Maternal Grandmother    Breast cancer Maternal Grandmother        BREAST CANCER-Age 59's   Cancer Maternal Grandfather        LUNG CA   Cancer Paternal Grandmother        BRAIN CA   Colon cancer Neg Hx    Colon polyps Neg Hx     Social History   Socioeconomic History   Marital status: Married    Spouse name: Not on file   Number of children: 2   Years of education: Not on file   Highest education level: Not on file  Occupational History    Occupation: Freight forwarder at [employer]  Tobacco Use   Smoking status: Never   Smokeless tobacco: Never  Vaping Use   Vaping Use: Never used  Substance and Sexual Activity   Alcohol use: No    Alcohol/week: 0.0 standard drinks of alcohol   Drug use: No   Sexual activity: Yes    Birth control/protection: Other-see comments    Comment: Vasectomy, INTERCOURSE AGE 62, SEXUAL PARTNERS LESS THAN 5  Other Topics Concern   Not on file  Social History Narrative   Not on file   Social Determinants of Health   Financial Resource Strain: Not on file  Food Insecurity: Not on file  Transportation Needs: Not on file  Physical Activity: Not on file  Stress: Not on file  Social Connections: Not on file  Intimate Partner Violence: Not on file    Review of Systems  All other systems reviewed and are negative.   PHYSICAL EXAMINATION:    BP 108/72 (BP Location: Left Arm, Patient Position: Sitting, Cuff Size: Normal)   Pulse 74   Ht '5\' 9"'$  (1.753 m)   Wt 193 lb (87.5 kg)   LMP 02/05/2022   SpO2 98%   BMI 28.50 kg/m     General appearance: alert, cooperative and appears stated age   Pelvic US  Uterus 9.31 x 6.35 x 5.24 cm.  Inhomogeneous myometrium with cystic change. Fibroids 1.48 cm.  EMS 7.16 mm, irregular endometrium.  Status post ablation.  Left ovary 4.17 x 3.55 x 2.72 cm.   3.4 x 2.4 cm simple cyst.  Right ovary 4.00 x 2.22 x 2.43 cm. No free fluid.   Sonohysterogram attempt  Betadine prep.  Tenaculum to anterior cervix.  Sterile cannula introduced through cervix.  Saline fluid would not pass into endometrial cavity.  Procedure abandoned.   EMB done with Hibiclens prep.  Paracervical block with 10 cc 1% lidocaine, lot [id number], exp 08/2024.  Os finder used to dilate the cervical canal.  Pipelle passed to 7.5 cm. X 2.  Tissue to pathology. No complications.  Minimal EBL.  Chaperone was present for exam:  Ivin Booty and Raquel Sarna.   ASSESSMENT  Endometrial mass.   Fibroid. Irregular menses.  I suspect the resumption of her period was due to weight loss.  Status post endometrial ablation.   PLAN  Pelvic US images and report reviewed. Fu EMB.  Final plan to follow.    An After Visit Summary was printed and given to the patient.  10 min  total time was spent for this patient encounter, including preparation, face-to-face counseling with the patient, coordination of care, and documentation of the encounter in addition to performing the attempted sonohysterogram and in addition to performing the endometrial biopsy.

## 2022-03-26 ENCOUNTER — Encounter: Payer: Self-pay | Admitting: Obstetrics and Gynecology

## 2022-03-26 ENCOUNTER — Ambulatory Visit: Payer: BC Managed Care – PPO | Admitting: Obstetrics and Gynecology

## 2022-03-26 ENCOUNTER — Other Ambulatory Visit (HOSPITAL_COMMUNITY)
Admission: RE | Admit: 2022-03-26 | Discharge: 2022-03-26 | Disposition: A | Discharge status: Home / Self Care | Payer: BC Managed Care – PPO | Source: Ambulatory Visit | Attending: Obstetrics and Gynecology | Admitting: Obstetrics and Gynecology

## 2022-03-26 ENCOUNTER — Ambulatory Visit (INDEPENDENT_AMBULATORY_CARE_PROVIDER_SITE_OTHER): Payer: BC Managed Care – PPO

## 2022-03-26 ENCOUNTER — Other Ambulatory Visit: Payer: Self-pay | Admitting: Obstetrics and Gynecology

## 2022-03-26 VITALS — BP 108/72 | HR 74 | Ht 69.0 in | Wt 193.0 lb

## 2022-03-26 DIAGNOSIS — D219 Benign neoplasm of connective and other soft tissue, unspecified: Secondary | ICD-10-CM | POA: Diagnosis not present

## 2022-03-26 DIAGNOSIS — N9489 Other specified conditions associated with female genital organs and menstrual cycle: Secondary | ICD-10-CM | POA: Diagnosis not present

## 2022-03-30 LAB — SURGICAL PATHOLOGY

## 2022-05-08 ENCOUNTER — Other Ambulatory Visit: Payer: Self-pay | Admitting: Obstetrics and Gynecology

## 2022-05-08 DIAGNOSIS — Z1231 Encounter for screening mammogram for malignant neoplasm of breast: Secondary | ICD-10-CM

## 2022-06-23 ENCOUNTER — Ambulatory Visit
Admission: RE | Admit: 2022-06-23 | Discharge: 2022-06-23 | Disposition: A | Discharge status: Home / Self Care | Payer: BC Managed Care – PPO | Source: Ambulatory Visit | Attending: Obstetrics and Gynecology | Admitting: Obstetrics and Gynecology

## 2022-06-23 DIAGNOSIS — Z1231 Encounter for screening mammogram for malignant neoplasm of breast: Secondary | ICD-10-CM

## 2023-03-25 ENCOUNTER — Other Ambulatory Visit (HOSPITAL_COMMUNITY): Payer: Self-pay | Admitting: Internal Medicine

## 2023-03-25 DIAGNOSIS — G43909 Migraine, unspecified, not intractable, without status migrainosus: Secondary | ICD-10-CM

## 2023-04-02 ENCOUNTER — Ambulatory Visit (HOSPITAL_COMMUNITY)
Admission: RE | Admit: 2023-04-02 | Discharge: 2023-04-02 | Disposition: A | Discharge status: Home / Self Care | Payer: No Typology Code available for payment source | Source: Ambulatory Visit | Attending: Internal Medicine | Admitting: Internal Medicine

## 2023-04-02 DIAGNOSIS — G43909 Migraine, unspecified, not intractable, without status migrainosus: Secondary | ICD-10-CM | POA: Insufficient documentation

## 2023-04-27 NOTE — Progress Notes (Deleted)
 53 y.o. G10P2002 Married Caucasian female here for annual exam.    PCP: Ignatius Specking, MD   No LMP recorded. (Menstrual status: Irregular Periods).           Sexually active: Yes.    The current method of family planning is vasectomy.    Menopausal hormone therapy:  n/a Exercising: {yes no:314532}  {types:19826} Smoker:  no  OB History  Gravida Para Term Preterm AB Living  2 2 2   2   SAB IAB Ectopic Multiple Live Births          # Outcome Date GA Lbr Len/2nd Weight Sex Type Anes PTL Lv  2 Term           1 Term              HEALTH MAINTENANCE: Last 2 paps:  11/17/21 ASCUS: HR HPV neg, 09/28/13 neg History of abnormal Pap or positive HPV:  yes Mammogram:   06/23/22 Breast Density Cat B, BI-RADS CAT 1 neg Colonoscopy:  01/05/18 Bone Density:  n/a  Result  n/a    There is no immunization history on file for this patient.    reports that she has never smoked. She has never used smokeless tobacco. She reports that she does not drink alcohol and does not use drugs.  Past Medical History:  Diagnosis Date   Diabetes ([facility]) 02/2016   Elevated cholesterol    High serum estradiol 11/17/2021   level 750, needs pelvic ultrasound   Hirsutism    Hypertension    Reflux     Past Surgical History:  Procedure Laterality Date   ENDOMETRIAL ABLATION  05/2008   ESOPHAGOGASTRODUODENOSCOPY N/A 01/05/2018   Procedure: ESOPHAGOGASTRODUODENOSCOPY (EGD);  Surgeon: Corbin Ade, MD;  [facility];  Service: Endoscopy;  Laterality: N/A;  2:00pm   HYSTEROSCOPY     POLYPECTOMY  01/05/2018   Procedure: POLYPECTOMY;  Surgeon: Corbin Ade, MD;  [facility];  Service: Endoscopy;;    Current Outpatient Medications  Medication Sig Dispense Refill   carboxymethylcellulose (REFRESH PLUS) 0.5 % SOLN Place 1 drop into both eyes 3 (three) times daily as needed.     hydrochlorothiazide (HYDRODIURIL) 25 MG tablet Take 25 mg by mouth daily.     ibuprofen (ADVIL,MOTRIN) 200  MG tablet Take 400 mg by mouth every 8 (eight) hours as needed (for pain).     metFORMIN (GLUCOPHAGE) 500 MG tablet Take 500 mg by mouth 2 (two) times daily.     metoprolol succinate (TOPROL-XL) 50 MG 24 hr tablet Take 50 mg by mouth at bedtime. Take with or immediately following a meal.      OZEMPIC, 0.25 OR 0.5 MG/DOSE, 2 MG/3ML SOPN Inject 0.5 mg into the skin once a week.     pantoprazole (PROTONIX) 40 MG tablet Take 40 mg by mouth daily.     rosuvastatin (CRESTOR) 10 MG tablet Take 10 mg by mouth daily.     TRESIBA FLEXTOUCH 100 UNIT/ML FlexTouch Pen Inject 10 Units into the skin at bedtime.     No current facility-administered medications for this visit.    ALLERGIES: Patient has no known allergies.  Family History  Problem Relation Age of Onset   Hypertension Mother    Hypertension Father    Hypertension Brother    Heart attack Maternal Grandmother    Heart disease Maternal Grandmother    Breast cancer Maternal Grandmother        BREAST CANCER-Age 81's  Cancer Maternal Grandfather        LUNG CA   Cancer Paternal Grandmother        BRAIN CA   Colon cancer Neg Hx    Colon polyps Neg Hx     Review of Systems  PHYSICAL EXAM:  There were no vitals taken for this visit.    General appearance: alert, cooperative and appears stated age Head: normocephalic, without obvious abnormality, atraumatic Neck: no adenopathy, supple, symmetrical, trachea midline and thyroid normal to inspection and palpation Lungs: clear to auscultation bilaterally Breasts: normal appearance, no masses or tenderness, No nipple retraction or dimpling, No nipple discharge or bleeding, No axillary adenopathy Heart: regular rate and rhythm Abdomen: soft, non-tender; no masses, no organomegaly Extremities: extremities normal, atraumatic, no cyanosis or edema Skin: skin color, texture, turgor normal. No rashes or lesions Lymph nodes: cervical, supraclavicular, and axillary nodes normal. Neurologic:  grossly normal  Pelvic: External genitalia:  no lesions              No abnormal inguinal nodes palpated.              Urethra:  normal appearing urethra with no masses, tenderness or lesions              Bartholins and Skenes: normal                 Vagina: normal appearing vagina with normal color and discharge, no lesions              Cervix: no lesions              Pap taken: {yes no:314532} Bimanual Exam:  Uterus:  normal size, contour, position, consistency, mobility, non-tender              Adnexa: no mass, fullness, tenderness              Rectal exam: {yes no:314532}.  Confirms.              Anus:  normal sphincter tone, no lesions  Chaperone was present for exam:  {BSCHAPERONE:31226::"Aithan Farrelly F, CMA"}  ASSESSMENT: Well woman visit with gynecologic exam.  PHQ-9: ***  ***  PLAN: Mammogram screening discussed. Self breast awareness reviewed. Pap and HRV collected:  {yes no:314532} Guidelines for Calcium, Vitamin D, regular exercise program including cardiovascular and weight bearing exercise. Medication refills:  *** {LABS (Optional):23779} Follow up:  ***    Additional counseling given.  {yes [id number]. ***  total time was spent for this patient encounter, including preparation, face-to-face counseling with the patient, coordination of care, and documentation of the encounter in addition to doing the well woman visit with gynecologic exam.

## 2023-05-11 ENCOUNTER — Ambulatory Visit: Admitting: Obstetrics and Gynecology

## 2023-05-11 ENCOUNTER — Ambulatory Visit: Payer: BC Managed Care – PPO | Admitting: Obstetrics and Gynecology

## 2023-05-12 NOTE — Progress Notes (Unsigned)
 53 y.o. G39P2002 Married Caucasian female here for annual exam.  Pt does want to discuss severe cramping, spotting after IC and irregular cycles.  Cycles Feb, March, April, December 2024.  Cycle in December had severe cramping. No period since December.  No hot flashes or night sweats.   Having postcoital bleeding after sex for the last 6 months. No partner change.  Hx endometrial ablation 2010.   Pelvic US on 01/01/22: Uterus 9.43 x 6.23 x 5.05 cm.  1.71 cm fibroid.  Inhomogeneous myometrium.   EMS 10.23 mm. Cystic changes with thickening and feeder vessel, 10 mm and possible 2.1 cm intracavitary mass.  Left ovary 4.33 x 3.44 x 3.05 cm.  1.5 cm. CL cyst. Right ovary 3.07 x 1.92 x 1.78 cm.  1.8 cm resolving right CL cyst and a 3.1 x 2.4 cm cystic structure.  No free fluid.   Patient had follow up imaging, attempted sonohysterogram and endometrial biopsy 03/26/22: Pelvic US  Uterus 9.31 x 6.35 x 5.24 cm.  Inhomogeneous myometrium with cystic change. Fibroids 1.48 cm.  EMS 7.16 mm, irregular endometrium.  Status post ablation.  Left ovary 4.17 x 3.55 x 2.72 cm.   3.4 x 2.4 cm simple cyst.  Right ovary 4.00 x 2.22 x 2.43 cm. No free fluid.   Sonohysterogram attempted but saline fluid would not pass into the endometrial cavity.   EMB showed benign proliferative endometrium.  No polyp, atypia, hyperplasia or malignancy noted.    Expecting a new grandchild in May, this is her 4th.   Father was diagnosed with brain cancer recently.  Patient worries about cancer in herself.   PCP: Ignatius Specking, MD   Patient's last menstrual period was 02/13/2023.           Sexually active: Yes.    The current method of family planning is vasectomy.    Menopausal hormone therapy:  n/a Exercising: No.   Smoker:  no  OB History  Gravida Para Term Preterm AB Living  2 2 2   2   SAB IAB Ectopic Multiple Live Births          # Outcome Date GA Lbr Len/2nd Weight Sex Type Anes PTL Lv  2 Term            1 Term              HEALTH MAINTENANCE: Last 2 paps:  11/17/21 ASCUS: HR HPV neg,01-24-17 Neg:Neg HR HPV History of abnormal Pap or positive HPV:  yes Mammogram:   06/23/22 Breast density Cat B, BI-RADS CAT 1 neg Colonoscopy:  2024 per pt Bone Density:  n/a  Result  n/a    There is no immunization history on file for this patient.    reports that she has never smoked. She has never used smokeless tobacco. She reports that she does not drink alcohol and does not use drugs.  Past Medical History:  Diagnosis Date   Diabetes (HCC) 02/2016   Elevated cholesterol    Hirsutism    Hypertension    Reflux     Past Surgical History:  Procedure Laterality Date   ENDOMETRIAL ABLATION  05/2008   ESOPHAGOGASTRODUODENOSCOPY N/A 01/05/2018   Procedure: ESOPHAGOGASTRODUODENOSCOPY (EGD);  Surgeon: Corbin Ade, MD;  [facility]: [facility];  Service: Endoscopy;  Laterality: N/A;  2:00pm   HYSTEROSCOPY     POLYPECTOMY  01/05/2018   Procedure: POLYPECTOMY;  Surgeon: Corbin Ade, MD;  Location: [facility];  Service: Endoscopy;;  Current Outpatient Medications  Medication Sig Dispense Refill   carboxymethylcellulose (REFRESH PLUS) 0.5 % SOLN Place 1 drop into both eyes 3 (three) times daily as needed.     ibuprofen (ADVIL,MOTRIN) 200 MG tablet Take 400 mg by mouth every 8 (eight) hours as needed (for pain).     metFORMIN (GLUCOPHAGE) 500 MG tablet Take 500 mg by mouth 2 (two) times daily.     metoprolol succinate (TOPROL-XL) 50 MG 24 hr tablet Take 50 mg by mouth at bedtime. Take with or immediately following a meal.      OZEMPIC, 0.25 OR 0.5 MG/DOSE, 2 MG/3ML SOPN Inject 0.5 mg into the skin once a week.     pantoprazole (PROTONIX) 40 MG tablet Take 40 mg by mouth daily.     rosuvastatin (CRESTOR) 10 MG tablet Take 10 mg by mouth daily.     No current facility-administered medications for this visit.    ALLERGIES: Patient has no known allergies.  Family History   Problem Relation Age of Onset   Hypertension Mother    Hypertension Father    Brain cancer Father    Hypertension Brother    Heart attack Maternal Grandmother    Heart disease Maternal Grandmother    Breast cancer Maternal Grandmother        BREAST CANCER-Age 75's   Cancer Maternal Grandfather        LUNG CA   Cancer Paternal Grandmother        BRAIN CA   Colon cancer Neg Hx    Colon polyps Neg Hx     Review of Systems  All other systems reviewed and are negative.   PHYSICAL EXAM:  BP 124/84 (BP Location: Left Arm, Patient Position: Sitting, Cuff Size: Small)   Pulse 79   Ht 5' 9.25" (1.759 m)   Wt 182 lb (82.6 kg)   LMP 02/13/2023   SpO2 99%   BMI 26.68 kg/m     General appearance: alert, cooperative and appears stated age Head: normocephalic, without obvious abnormality, atraumatic Neck: no adenopathy, supple, symmetrical, trachea midline and thyroid normal to inspection and palpation Lungs: clear to auscultation bilaterally Breasts: normal appearance, no masses or tenderness, No nipple retraction or dimpling, No nipple discharge or bleeding, No axillary adenopathy Heart: regular rate and rhythm Abdomen: soft, non-tender; no masses, no organomegaly Extremities: extremities normal, atraumatic, no cyanosis or edema Skin: skin color, texture, turgor normal. No rashes or lesions Lymph nodes: cervical, supraclavicular, and axillary nodes normal. Neurologic: grossly normal  Pelvic: External genitalia:  no lesions              No abnormal inguinal nodes palpated.              Urethra:  normal appearing urethra with no masses, tenderness or lesions              Bartholins and Skenes: normal                 Vagina: normal appearing vagina with normal color and discharge, no lesions              Cervix: no lesions.  Bleeds slightly with pap.              Pap taken: Yes.   Bimanual Exam:  Uterus:  normal size, contour, position, consistency, mobility, non-tender               Adnexa: no mass, fullness, tenderness  Rectal exam: Yes.  .  Confirms.              Anus:  normal sphincter tone, no lesions  Chaperone was present for exam:  Warren Lacy, CMA  ASSESSMENT: Well woman visit with gynecologic exam. Status post endometrial ablation.  Irregular menses.  Dysmenorrhea. Uterine fibroid. Possible adenomyosis on prior US.  Postcoital bleeding.  Hx ASCUS pap and neg HR HPV. Hx ovarian cysts. Vasectomy for pregnancy prevention.  PHQ-9: 0 HTN.   PLAN: Mammogram screening discussed. Self breast awareness reviewed. Pap and HRV collected:  yes.  STD screening for GC/CT/trich.  Patient agrees.  Guidelines for Calcium, Vitamin D, regular exercise program including cardiovascular and weight bearing exercise. Medication refills:  NA Prior pelvic ultrasounds and endometrial biopsy results reviewed with patient.  Check FSH and estradiol. Return for pelvic ultrasound.  Follow up:  yearly and prn.

## 2023-05-13 ENCOUNTER — Other Ambulatory Visit (HOSPITAL_COMMUNITY)
Admission: RE | Admit: 2023-05-13 | Discharge: 2023-05-13 | Disposition: A | Discharge status: Home / Self Care | Source: Ambulatory Visit | Attending: Obstetrics and Gynecology | Admitting: Obstetrics and Gynecology

## 2023-05-13 ENCOUNTER — Encounter: Payer: Self-pay | Admitting: Obstetrics and Gynecology

## 2023-05-13 ENCOUNTER — Ambulatory Visit (INDEPENDENT_AMBULATORY_CARE_PROVIDER_SITE_OTHER): Admitting: Obstetrics and Gynecology

## 2023-05-13 VITALS — BP 124/84 | HR 79 | Ht 69.25 in | Wt 182.0 lb

## 2023-05-13 DIAGNOSIS — N926 Irregular menstruation, unspecified: Secondary | ICD-10-CM

## 2023-05-13 DIAGNOSIS — Z1331 Encounter for screening for depression: Secondary | ICD-10-CM | POA: Diagnosis not present

## 2023-05-13 DIAGNOSIS — N946 Dysmenorrhea, unspecified: Secondary | ICD-10-CM

## 2023-05-13 DIAGNOSIS — Z01419 Encounter for gynecological examination (general) (routine) without abnormal findings: Secondary | ICD-10-CM

## 2023-05-13 DIAGNOSIS — Z124 Encounter for screening for malignant neoplasm of cervix: Secondary | ICD-10-CM | POA: Insufficient documentation

## 2023-05-13 DIAGNOSIS — Z113 Encounter for screening for infections with a predominantly sexual mode of transmission: Secondary | ICD-10-CM

## 2023-05-13 DIAGNOSIS — N930 Postcoital and contact bleeding: Secondary | ICD-10-CM | POA: Diagnosis not present

## 2023-05-13 LAB — FOLLICLE STIMULATING HORMONE: FSH: 3.2 m[IU]/mL

## 2023-05-13 LAB — ESTRADIOL: Estradiol: 264 pg/mL

## 2023-05-13 NOTE — Patient Instructions (Signed)

## 2023-05-14 ENCOUNTER — Encounter: Payer: Self-pay | Admitting: Obstetrics and Gynecology

## 2023-05-14 LAB — CYTOLOGY - PAP
Chlamydia: NEGATIVE
Comment: NEGATIVE
Comment: NEGATIVE
Comment: NEGATIVE
Comment: NORMAL
Diagnosis: UNDETERMINED — AB
High risk HPV: NEGATIVE
Neisseria Gonorrhea: NEGATIVE
Trichomonas: NEGATIVE

## 2023-05-26 NOTE — Progress Notes (Signed)
 GYNECOLOGY  VISIT   HPI: 53 y.o.   Married  Caucasian female   G2P2002 with Patient's last menstrual period was 02/13/2023.   here for: U/S consult for dysmenorrhea and post coital bleeding for 6 months.  Her husband is present for the visit today.  Periods are not often but can be very painful.  Really concerned about cancer.   Hx endometrial ablation 2010.   Pelvic US  on 01/01/22: Uterus 9.43 x 6.23 x 5.05 cm.  1.71 cm fibroid.  Inhomogeneous myometrium.   EMS 10.23 mm. Cystic changes with thickening and feeder vessel, 10 mm and possible 2.1 cm intracavitary mass.  Left ovary 4.33 x 3.44 x 3.05 cm.  1.5 cm. CL cyst. Right ovary 3.07 x 1.92 x 1.78 cm.  1.8 cm resolving right CL cyst and a 3.1 x 2.4 cm cystic structure.  No free fluid.    Patient had follow up imaging, attempted sonohysterogram and endometrial biopsy 03/26/22: Pelvic US   Uterus 9.31 x 6.35 x 5.24 cm.  Inhomogeneous myometrium with cystic change. Fibroids 1.48 cm.  EMS 7.16 mm, irregular endometrium.  Status post ablation.  Left ovary 4.17 x 3.55 x 2.72 cm.   3.4 x 2.4 cm simple cyst.  Right ovary 4.00 x 2.22 x 2.43 cm. No free fluid.   Sonohysterogram attempted but saline fluid would not pass into the endometrial cavity.   EMB 03/26/22 showed benign proliferative endometrium.  No polyp, atypia, hyperplasia or malignancy noted.   FSH 3.2 on 05/13/23.  Has 7 - 8 UTIs per year for several years.   Has seen her PCP and has had virtual visits.  UTIs may or may not related to sex.   GYNECOLOGIC HISTORY: Patient's last menstrual period was 02/13/2023. Contraception:  vasectomy Menopausal hormone therapy:  n/a Last 2 paps:  05/13/23 ASCUS: HR HPV neg, 11/17/21 ASCUS: HR HPV neg History of abnormal Pap or positive HPV:  yes Mammogram:  06/23/22 Breast Density Cat B, BI-RADS CAT 1 neg        OB History     Gravida  2   Para  2   Term  2   Preterm      AB      Living  2      SAB      IAB       Ectopic      Multiple      Live Births                 Patient Active Problem List   Diagnosis Date Noted   Constipation 11/24/2017   Chronic cough 11/24/2017   GERD (gastroesophageal reflux disease) 08/31/2016   Type 2 diabetes mellitus with obesity (HCC) 01/22/2016   Fibroids, intramural 10/25/2013   Hypercholesterolemia 01/12/2011    Past Medical History:  Diagnosis Date   Diabetes (HCC) 02/2016   Elevated cholesterol    Hirsutism    Hypertension    Reflux     Past Surgical History:  Procedure Laterality Date   ENDOMETRIAL ABLATION  05/2008   ESOPHAGOGASTRODUODENOSCOPY N/A 01/05/2018   Procedure: ESOPHAGOGASTRODUODENOSCOPY (EGD);  Surgeon: Suzette Espy, MD;  Location: [facility];  Service: Endoscopy;  Laterality: N/A;  2:00pm   HYSTEROSCOPY     POLYPECTOMY  01/05/2018   Procedure: POLYPECTOMY;  Surgeon: Suzette Espy, MD;  Location: [facility];  Service: Endoscopy;;    Current Outpatient Medications  Medication Sig Dispense Refill   ibuprofen (ADVIL,MOTRIN) 200 MG tablet Take  400 mg by mouth every 8 (eight) hours as needed (for pain).     metFORMIN (GLUCOPHAGE) 500 MG tablet Take 500 mg by mouth 2 (two) times daily.     metoprolol succinate (TOPROL-XL) 50 MG 24 hr tablet Take 50 mg by mouth at bedtime. Take with or immediately following a meal.      OZEMPIC, 0.25 OR 0.5 MG/DOSE, 2 MG/3ML SOPN Inject 0.5 mg into the skin once a week.     pantoprazole (PROTONIX) 40 MG tablet Take 40 mg by mouth daily.     rosuvastatin (CRESTOR) 10 MG tablet Take 10 mg by mouth daily.     No current facility-administered medications for this visit.     ALLERGIES: Patient has no known allergies.  Family History  Problem Relation Age of Onset   Hypertension Mother    Hypertension Father    Brain cancer Father    Hypertension Brother    Heart attack Maternal Grandmother    Heart disease Maternal Grandmother    Breast cancer Maternal Grandmother        BREAST  CANCER-Age 12's   Cancer Maternal Grandfather        LUNG CA   Cancer Paternal Grandmother        BRAIN CA   Colon cancer Neg Hx    Colon polyps Neg Hx     Social History   Socioeconomic History   Marital status: Married    Spouse name: Not on file   Number of children: 2   Years of education: Not on file   Highest education level: Not on file  Occupational History   Occupation: Production designer, theatre/television/film at [employer]  Tobacco Use   Smoking status: Never   Smokeless tobacco: Never  Vaping Use   Vaping status: Never Used  Substance and Sexual Activity   Alcohol use: No    Alcohol/week: 0.0 standard drinks of alcohol   Drug use: No   Sexual activity: Yes    Birth control/protection: Other-see comments    Comment: Vasectomy, INTERCOURSE AGE 1, SEXUAL PARTNERS LESS THAN 5  Other Topics Concern   Not on file  Social History Narrative   Not on file   Social Drivers of Health   Financial Resource Strain: Not on file  Food Insecurity: Not on file  Transportation Needs: Not on file  Physical Activity: Not on file  Stress: Not on file  Social Connections: Not on file  Intimate Partner Violence: Not on file    Review of Systems  All other systems reviewed and are negative.   PHYSICAL EXAMINATION:   BP 134/82 (BP Location: Right Arm, Patient Position: Sitting, Cuff Size: Small)   Pulse 72   Wt 182 lb (82.6 kg)   LMP 02/13/2023   SpO2 99%   BMI 26.68 kg/m     General appearance: alert, cooperative and appears stated age  Pelvic US  Uterus 10.54 x 6.56 x 4.81 cm.  2 intramural fibroids:  1.59 cm, 1.25 cm.  Myometrial cysts noted.  EMS 7.86 mm, cystic areas, avascular.  Hx endometrial ablation.  Left ovary 5.35 x 2.98 x 3.54 cm.  Two simple cysts:  2.32 cm, 2.87 cm.  Right ovary 3.10 x 2.15 x 1.79 cm.  Simple follicles noted.  No adnexal masses.  No free fluid.   ASSESSMENT:  Status post endometrial ablation.  Irregular menses.  Skipped cycles.  Probable  perimenopause. Status post endometrial ablation.  Dysmenorrhea. Uterine fibroids. Left ovarian cysts.  Myometrial cysts consistent with suspected  adenomyosis. Postcoital bleeding.  Hx ASCUS pap and neg HR HPV x 2.  Vasectomy for pregnancy prevention HTN.  Recurrent UTIs.   PLAN:  Pelvic US  images and report reviewed. Findings are likely consistent with benign findings.  Endometrial biopsy can be performed if desired.  Prior findings approximately one year ago were negative for hyperplasia and malignancy.  No follow up biopsy planned at this time.  Hysterectomy versus Micronor discussed for treatment options.  I also mentioned treatment to place her in early menopause.  Risks and benefits reviewed of each treatment option.  She will consider her choices.  Vaginal estradiol versus lubricants for post coital bleeding likely related to perimenopausal change.  Replens is preferred by patient.  She understands that estradiol can reduce postmenopausal UTIs.  She is using a product with cranberry in it.  Referral to Urology, Dr. Valeta Gaudier, for recurrent UTIs.  Pap and HR HPV in 3 years.  Fu prn.   50 min  total time was spent for this patient encounter, including preparation, face-to-face counseling with the patient, coordination of care, and documentation of the encounter.

## 2023-06-09 ENCOUNTER — Ambulatory Visit

## 2023-06-09 ENCOUNTER — Encounter: Payer: Self-pay | Admitting: Obstetrics and Gynecology

## 2023-06-09 ENCOUNTER — Ambulatory Visit: Admitting: Obstetrics and Gynecology

## 2023-06-09 VITALS — BP 134/82 | HR 72 | Ht 69.25 in | Wt 182.0 lb

## 2023-06-09 DIAGNOSIS — N946 Dysmenorrhea, unspecified: Secondary | ICD-10-CM

## 2023-06-09 DIAGNOSIS — N926 Irregular menstruation, unspecified: Secondary | ICD-10-CM

## 2023-06-09 DIAGNOSIS — Z87440 Personal history of urinary (tract) infections: Secondary | ICD-10-CM

## 2023-06-09 DIAGNOSIS — D219 Benign neoplasm of connective and other soft tissue, unspecified: Secondary | ICD-10-CM | POA: Diagnosis not present

## 2023-06-09 DIAGNOSIS — N83202 Unspecified ovarian cyst, left side: Secondary | ICD-10-CM | POA: Diagnosis not present

## 2023-06-09 DIAGNOSIS — R87610 Atypical squamous cells of undetermined significance on cytologic smear of cervix (ASC-US): Secondary | ICD-10-CM

## 2023-06-09 DIAGNOSIS — Z9889 Other specified postprocedural states: Secondary | ICD-10-CM | POA: Diagnosis not present

## 2023-06-09 DIAGNOSIS — N930 Postcoital and contact bleeding: Secondary | ICD-10-CM

## 2023-06-09 NOTE — Patient Instructions (Signed)
 Norethindrone Tablets (Contraception) What is this medication? NORETHINDRONE (nor eth IN drone) prevents ovulation and pregnancy. It belongs to a group of medications called contraceptives. This medication is a progestin hormone. This medicine may be used for other purposes; ask your health care provider or pharmacist if you have questions. COMMON BRAND NAME(S): Camila, Deblitane 28-Day, Errin, [place], Jencycla, Jolivette, Lyza, Nor-QD, Nora-BE, Norlyroc, Ortho Micronor, Sharobel 28-Day What should I tell my care team before I take this medication? They need to know if you have any of these conditions: Blood vessel disease or blood clots Breast, cervical, or vaginal cancer Diabetes Heart disease Kidney disease Liver disease Mental depression Migraine Seizures Stroke Vaginal bleeding An unusual or allergic reaction to norethindrone, other medications, foods, dyes, or preservatives Pregnant or trying to get pregnant Breast-feeding How should I use this medication? Take this medication by mouth with a glass of water. You may take it with or without food. Follow the directions on the prescription label. Take this medication at the same time each day and in the order directed on the package. Do not take your medication more often than directed. Contact your care team about the use of this medication in children. Special care may be needed. This medication has been used in female children who have started having menstrual periods. A patient package insert for the product will be given with each prescription and refill. Read this sheet carefully each time. The sheet may change frequently. Overdosage: If you think you have taken too much of this medicine contact a poison control center or emergency room at once. NOTE: This medicine is only for you. Do not share this medicine with others. What if I miss a dose? If you miss a dose, refer to the patient package insert for instruction. This  medication may not work as well if you miss more than 1 pill. You may need to use back-up contraception. What may interact with this medication? Do not take this medication with any of the following: Amprenavir or fosamprenavir Bosentan This medication may also interact with the following: Antibiotics or medications for infections, especially rifampin, rifabutin, rifapentine, and griseofulvin, and possibly penicillins or tetracyclines Aprepitant Barbiturate medications, such as phenobarbital Carbamazepine Felbamate Modafinil Oxcarbazepine Phenytoin Ritonavir or other medications for HIV infection or AIDS St. John's wort Topiramate This list may not describe all possible interactions. Give your health care provider a list of all the medicines, herbs, non-prescription drugs, or dietary supplements you use. Also tell them if you smoke, drink alcohol, or use illegal drugs. Some items may interact with your medicine. What should I watch for while using this medication? Visit your care team for regular health checks while on this medication. You may need to use another form of contraception, such as a condom, during the first cycle of this medication. If you may be pregnant, stop taking this medication right away and contact your care team. What side effects may I notice from receiving this medication? Side effects that you should report to your care team as soon as possible: Allergic reactions--skin rash, itching, hives, swelling of the face, lips, tongue, or throat Blood clot--pain, swelling, or warmth in the leg, shortness of breath, chest pain Gallbladder problems--severe stomach pain, nausea, vomiting, fever Increase in blood pressure Liver injury--right upper belly pain, loss of appetite, nausea, light-colored stool, dark yellow or brown urine, yellowing skin or eyes, unusual weakness or fatigue New or worsening migraines or headaches Stroke--sudden numbness or weakness of the face,  arm, or leg,  trouble speaking, confusion, trouble walking, loss of balance or coordination, dizziness, severe headache, change in vision Unusual vaginal discharge, itching, or odor Worsening mood, feelings of depression Side effects that usually do not require medical attention (report to your care team if they continue or are bothersome): Breast pain or tenderness Dark patches of skin on the face or other sun-exposed areas Irregular menstrual cycles or spotting Nausea Weight gain This list may not describe all possible side effects. Call your doctor for medical advice about side effects. You may report side effects to FDA at 1-800-FDA-1088. Where should I keep my medication? Keep out of the reach of children and pets. Store at room temperature between 15 and 30 degrees C (59 and 86 degrees F). Throw away any unused medication after the expiration date. NOTE: This sheet is a summary. It may not cover all possible information. If you have questions about this medicine, talk to your doctor, pharmacist, or health care provider.  2024 Elsevier/Gold Standard (2022-07-17 00:00:00)  Hysterectomy Information  A hysterectomy is a surgery to take out the uterus. The uterus is where a baby grows when a person is pregnant. Other organs may also be taken out. They are: The organs that make eggs (ovaries). The tubes that move the egg to the uterus (fallopian tubes). The lowest part of the uterus (cervix). After the surgery, you'll no longer have your period. You'll not be able to get pregnant. This surgery can affect the way you feel. Talk with your health care provider about the physical and emotional effects of this surgery. Why is a hysterectomy done? This surgery may be done if: You have growths in the uterus, such as fibroids. This is the most common reason. The lining of the uterus grows outside the uterus. Your uterus has moved down and bulges down into the vagina. You have abnormal or heavy  bleeding during your period. You have pain in the pelvic area, and the pain lasts a long time. You have cancer of the uterus or cervix. What are the risks of hysterectomy? Your provider will talk with you about risks. These may include: Bleeding or blood clots in the legs or lung. Infection. Injury to areas close to the uterus. These include the nerves, bladder, or bowel. Reaction to the medicines used. Early symptoms of menopause, if both ovaries are taken out. These include hot flashes, vaginal dryness, night sweats, and lack of sleep. What can be taken out during a hysterectomy? This surgery can be done: To take out the top part of the uterus only. The cervix is not taken out. To take out the uterus and cervix. To take out the uterus, the cervix, and the tissue that holds the uterus. In some cases, your ovaries may also be taken out. The parts that will be taken out are based on the reason why you need this surgery. What are types of this surgery? The uterus can be taken out in many ways. Talk with your provider about the best surgery for you.  Here are the ones that are done most often. There are benefits and risks for each. Abdominal hysterectomy One big cut is made in your belly. The uterus and other organs are taken out through this cut. This method is used: When your provider wants a better way to get to your uterus. If you have scars inside your belly that stick to other parts or organs. These are called adhesions. If you have this surgery: You may take longer to get  well because of the big cut in your belly. There's a higher risk of injury to other tissues and organs. You may get adhesions. Vaginal hysterectomy Cuts are made in the top of the vagina. No cuts are made on your skin. The uterus is taken out through the vagina. If you have this surgery: You may have a shorter stay in the hospital. There's a lower risk of getting adhesions. You may have fewer problems after  surgery. Laparoscopic-assisted vaginal hysterectomy (LAVH) Many small cuts are made in your belly and inside your vagina. LAVH is done with long, thin tools and cameras. Robots are also used. LAVH takes longer. There's also higher risk of injury to other organs. But there's less bleeding. If you have LAVH: There's less risk that germs will get into your body. You'll have a short stay in the hospital. You'll go back to your normal activities sooner. What happens after the surgery? You will be given pain medicine. You may need to stay in the hospital for 1-2 days. You may need to have an adult stay with you for a few days after you go home. You will not be able to lift heavy objects. You will not be able to put anything in your vagina for the first 6 weeks or for the time told by your provider. This includes douching, having sex, and using tampons. If your ovaries were taken out, you may get hot flashes. You may also have night sweats, vaginal dryness, and trouble sleeping. Questions to ask your health care provider Is this surgery needed? What other options do I have? What organs need to be taken out? How long will I need to stay in the hospital? How long will I need to get better at home? What symptoms can I expect after the surgery? This information is not intended to replace advice given to you by your health care provider. Make sure you discuss any questions you have with your health care provider. Document Revised: 11/12/2022 Document Reviewed: 05/29/2022 Elsevier Patient Education  2024 [employer].

## 2023-06-28 ENCOUNTER — Other Ambulatory Visit: Payer: Self-pay | Admitting: Obstetrics and Gynecology

## 2023-06-28 DIAGNOSIS — Z1231 Encounter for screening mammogram for malignant neoplasm of breast: Secondary | ICD-10-CM

## 2023-07-21 ENCOUNTER — Ambulatory Visit
Admission: RE | Admit: 2023-07-21 | Discharge: 2023-07-21 | Disposition: A | Discharge status: Home / Self Care | Source: Ambulatory Visit | Attending: Obstetrics and Gynecology | Admitting: Obstetrics and Gynecology

## 2023-07-21 DIAGNOSIS — Z1231 Encounter for screening mammogram for malignant neoplasm of breast: Secondary | ICD-10-CM

## 2023-07-23 ENCOUNTER — Ambulatory Visit: Payer: Self-pay | Admitting: Obstetrics and Gynecology
